# Patient Record
Sex: Male | Born: 1955 | Race: White | Hispanic: No | Marital: Married | Smoking: Never smoker
Health system: Southern US, Community
[De-identification: ages and names within clinical notes are randomized; demographics above are authoritative.]

## PROBLEM LIST (undated history)

## (undated) DIAGNOSIS — E039 Hypothyroidism, unspecified: Secondary | ICD-10-CM

## (undated) DIAGNOSIS — I10 Essential (primary) hypertension: Secondary | ICD-10-CM

## (undated) DIAGNOSIS — C109 Malignant neoplasm of oropharynx, unspecified: Secondary | ICD-10-CM

## (undated) DIAGNOSIS — Z923 Personal history of irradiation: Secondary | ICD-10-CM

## (undated) DIAGNOSIS — R911 Solitary pulmonary nodule: Secondary | ICD-10-CM

## (undated) HISTORY — DX: Hypothyroidism, unspecified: E03.9

## (undated) HISTORY — DX: Solitary pulmonary nodule: R91.1

## (undated) HISTORY — PX: OTHER SURGICAL HISTORY: SHX169

## (undated) HISTORY — DX: Personal history of irradiation: Z92.3

## (undated) HISTORY — PX: WISDOM TOOTH EXTRACTION: SHX21

## (undated) HISTORY — DX: Essential (primary) hypertension: I10

## (undated) HISTORY — DX: Malignant neoplasm of oropharynx, unspecified: C10.9

## (undated) HISTORY — PX: APPENDECTOMY: SHX54

## (undated) HISTORY — PX: COLONOSCOPY: SHX174

---

## 2008-08-11 ENCOUNTER — Ambulatory Visit: Payer: Self-pay | Admitting: Gastroenterology

## 2008-08-25 ENCOUNTER — Ambulatory Visit: Payer: Self-pay | Admitting: Gastroenterology

## 2009-05-07 HISTORY — PX: NECK LESION BIOPSY: SHX2078

## 2009-11-10 ENCOUNTER — Encounter: Admission: RE | Admit: 2009-11-10 | Discharge: 2009-11-10 | Payer: Self-pay | Admitting: Family Medicine

## 2009-11-20 DIAGNOSIS — C109 Malignant neoplasm of oropharynx, unspecified: Secondary | ICD-10-CM

## 2009-11-20 HISTORY — DX: Malignant neoplasm of oropharynx, unspecified: C10.9

## 2009-11-23 ENCOUNTER — Ambulatory Visit (HOSPITAL_COMMUNITY): Admission: RE | Admit: 2009-11-23 | Discharge: 2009-11-23 | Payer: Self-pay | Admitting: Otolaryngology

## 2009-11-29 ENCOUNTER — Ambulatory Visit: Payer: Self-pay | Admitting: Oncology

## 2009-12-01 ENCOUNTER — Encounter: Admission: AD | Admit: 2009-12-01 | Discharge: 2009-12-01 | Payer: Self-pay | Admitting: Dentistry

## 2009-12-01 ENCOUNTER — Ambulatory Visit: Payer: Self-pay | Admitting: Dentistry

## 2009-12-01 ENCOUNTER — Ambulatory Visit: Admission: RE | Admit: 2009-12-01 | Discharge: 2010-03-01 | Payer: Self-pay | Admitting: Radiation Oncology

## 2009-12-02 LAB — CBC WITH DIFFERENTIAL/PLATELET
BASO%: 0.5 % (ref 0.0–2.0)
Basophils Absolute: 0 10*3/uL (ref 0.0–0.1)
HCT: 44.2 % (ref 38.4–49.9)
HGB: 15.3 g/dL (ref 13.0–17.1)
MCV: 86.2 fL (ref 79.3–98.0)
MONO%: 10.2 % (ref 0.0–14.0)
NEUT%: 68.3 % (ref 39.0–75.0)
Platelets: 212 10*3/uL (ref 140–400)
RDW: 13 % (ref 11.0–14.6)

## 2009-12-02 LAB — COMPREHENSIVE METABOLIC PANEL
ALT: 18 U/L (ref 0–53)
AST: 15 U/L (ref 0–37)
Alkaline Phosphatase: 78 U/L (ref 39–117)
Chloride: 104 mEq/L (ref 96–112)
Creatinine, Ser: 1.18 mg/dL (ref 0.40–1.50)
Potassium: 4 mEq/L (ref 3.5–5.3)
Sodium: 140 mEq/L (ref 135–145)
Total Bilirubin: 0.3 mg/dL (ref 0.3–1.2)
Total Protein: 6.9 g/dL (ref 6.0–8.3)

## 2009-12-19 ENCOUNTER — Ambulatory Visit: Payer: Self-pay | Admitting: Dentistry

## 2009-12-26 LAB — COMPREHENSIVE METABOLIC PANEL
ALT: 17 U/L (ref 0–53)
BUN: 15 mg/dL (ref 6–23)
Chloride: 105 mEq/L (ref 96–112)
Creatinine, Ser: 1 mg/dL (ref 0.40–1.50)
Sodium: 138 mEq/L (ref 135–145)
Total Bilirubin: 0.8 mg/dL (ref 0.3–1.2)

## 2009-12-26 LAB — CBC WITH DIFFERENTIAL/PLATELET
Basophils Absolute: 0 10*3/uL (ref 0.0–0.1)
HGB: 14.4 g/dL (ref 13.0–17.1)
MCH: 29.9 pg (ref 27.2–33.4)
MCHC: 34.7 g/dL (ref 32.0–36.0)
MCV: 86.2 fL (ref 79.3–98.0)
MONO%: 10.3 % (ref 0.0–14.0)
NEUT%: 68.3 % (ref 39.0–75.0)
RBC: 4.82 10*6/uL (ref 4.20–5.82)
RDW: 13.3 % (ref 11.0–14.6)
WBC: 6.2 10*3/uL (ref 4.0–10.3)
lymph#: 1.2 10*3/uL (ref 0.9–3.3)

## 2009-12-26 LAB — MAGNESIUM: Magnesium: 2 mg/dL (ref 1.5–2.5)

## 2009-12-27 ENCOUNTER — Ambulatory Visit (HOSPITAL_COMMUNITY): Admission: RE | Admit: 2009-12-27 | Discharge: 2009-12-27 | Payer: Self-pay | Admitting: Oncology

## 2009-12-30 ENCOUNTER — Ambulatory Visit: Payer: Self-pay | Admitting: Oncology

## 2010-01-10 LAB — CBC WITH DIFFERENTIAL/PLATELET
Basophils Absolute: 0.1 10*3/uL (ref 0.0–0.1)
Eosinophils Absolute: 0.2 10*3/uL (ref 0.0–0.5)
HGB: 13.3 g/dL (ref 13.0–17.1)
LYMPH%: 45 % (ref 14.0–49.0)
MCH: 29.6 pg (ref 27.2–33.4)
MONO#: 0.6 10*3/uL (ref 0.1–0.9)
MONO%: 33 % — ABNORMAL HIGH (ref 0.0–14.0)
NEUT#: 0.2 10*3/uL — CL (ref 1.5–6.5)
RBC: 4.5 10*6/uL (ref 4.20–5.82)
RDW: 13.5 % (ref 11.0–14.6)
lymph#: 0.9 10*3/uL (ref 0.9–3.3)

## 2010-01-10 LAB — COMPREHENSIVE METABOLIC PANEL
Alkaline Phosphatase: 78 U/L (ref 39–117)
BUN: 10 mg/dL (ref 6–23)
CO2: 26 mEq/L (ref 19–32)
Calcium: 8.9 mg/dL (ref 8.4–10.5)
Creatinine, Ser: 1 mg/dL (ref 0.40–1.50)
Total Bilirubin: 0.3 mg/dL (ref 0.3–1.2)

## 2010-01-19 LAB — CBC WITH DIFFERENTIAL/PLATELET
BASO%: 1.8 % (ref 0.0–2.0)
Basophils Absolute: 0.1 10*3/uL (ref 0.0–0.1)
HGB: 14.8 g/dL (ref 13.0–17.1)
MONO%: 14.2 % — ABNORMAL HIGH (ref 0.0–14.0)
NEUT%: 55.7 % (ref 39.0–75.0)
Platelets: 173 10*3/uL (ref 140–400)
RDW: 13.9 % (ref 11.0–14.6)
WBC: 5.1 10*3/uL (ref 4.0–10.3)
lymph#: 1.3 10*3/uL (ref 0.9–3.3)
nRBC: 0 % (ref 0–0)

## 2010-01-19 LAB — COMPREHENSIVE METABOLIC PANEL
AST: 17 U/L (ref 0–37)
Albumin: 4 g/dL (ref 3.5–5.2)
CO2: 27 mEq/L (ref 19–32)
Calcium: 9.3 mg/dL (ref 8.4–10.5)
Glucose, Bld: 88 mg/dL (ref 70–99)
Sodium: 138 mEq/L (ref 135–145)
Total Bilirubin: 1 mg/dL (ref 0.3–1.2)

## 2010-01-25 LAB — BASIC METABOLIC PANEL
Calcium: 9.2 mg/dL (ref 8.4–10.5)
Chloride: 101 mEq/L (ref 96–112)
Creatinine, Ser: 0.95 mg/dL (ref 0.40–1.50)
Glucose, Bld: 100 mg/dL — ABNORMAL HIGH (ref 70–99)

## 2010-01-31 ENCOUNTER — Ambulatory Visit: Payer: Self-pay | Admitting: Oncology

## 2010-02-02 LAB — BASIC METABOLIC PANEL
Calcium: 9 mg/dL (ref 8.4–10.5)
Glucose, Bld: 99 mg/dL (ref 70–99)
Sodium: 141 mEq/L (ref 135–145)

## 2010-02-02 LAB — MAGNESIUM: Magnesium: 2.2 mg/dL (ref 1.5–2.5)

## 2010-02-08 LAB — CBC WITH DIFFERENTIAL/PLATELET
BASO%: 1.1 % (ref 0.0–2.0)
Basophils Absolute: 0.1 10*3/uL (ref 0.0–0.1)
HCT: 41.7 % (ref 38.4–49.9)
HGB: 14.1 g/dL (ref 13.0–17.1)
LYMPH%: 21.6 % (ref 14.0–49.0)
MCH: 29.6 pg (ref 27.2–33.4)
MCHC: 33.7 g/dL (ref 32.0–36.0)
MONO#: 0.8 10*3/uL (ref 0.1–0.9)
MONO%: 14.4 % — ABNORMAL HIGH (ref 0.0–14.0)
NEUT%: 61.5 % (ref 39.0–75.0)
Platelets: 239 10*3/uL (ref 140–400)
RDW: 14.9 % — ABNORMAL HIGH (ref 11.0–14.6)

## 2010-02-08 LAB — COMPREHENSIVE METABOLIC PANEL
ALT: 22 U/L (ref 0–53)
AST: 17 U/L (ref 0–37)
CO2: 30 mEq/L (ref 19–32)
Calcium: 9.4 mg/dL (ref 8.4–10.5)
Chloride: 105 mEq/L (ref 96–112)
Glucose, Bld: 97 mg/dL (ref 70–99)
Sodium: 141 mEq/L (ref 135–145)

## 2010-02-08 LAB — MAGNESIUM: Magnesium: 2.1 mg/dL (ref 1.5–2.5)

## 2010-02-16 LAB — BASIC METABOLIC PANEL
CO2: 29 mEq/L (ref 19–32)
Calcium: 9.1 mg/dL (ref 8.4–10.5)
Chloride: 102 mEq/L (ref 96–112)
Potassium: 4.3 mEq/L (ref 3.5–5.3)

## 2010-02-22 LAB — BASIC METABOLIC PANEL
BUN: 17 mg/dL (ref 6–23)
Sodium: 139 mEq/L (ref 135–145)

## 2010-03-01 LAB — CBC WITH DIFFERENTIAL/PLATELET
Eosinophils Absolute: 0.1 10*3/uL (ref 0.0–0.5)
LYMPH%: 27.6 % (ref 14.0–49.0)
MCHC: 34.2 g/dL (ref 32.0–36.0)
MONO#: 0.8 10*3/uL (ref 0.1–0.9)
NEUT#: 2 10*3/uL (ref 1.5–6.5)
NEUT%: 50.1 % (ref 39.0–75.0)
Platelets: 206 10*3/uL (ref 140–400)
WBC: 4 10*3/uL (ref 4.0–10.3)

## 2010-03-01 LAB — COMPREHENSIVE METABOLIC PANEL
ALT: 19 U/L (ref 0–53)
CO2: 30 mEq/L (ref 19–32)
Calcium: 9 mg/dL (ref 8.4–10.5)
Chloride: 103 mEq/L (ref 96–112)
Creatinine, Ser: 1.07 mg/dL (ref 0.40–1.50)
Potassium: 3.8 mEq/L (ref 3.5–5.3)
Sodium: 138 mEq/L (ref 135–145)
Total Bilirubin: 0.4 mg/dL (ref 0.3–1.2)

## 2010-03-02 ENCOUNTER — Ambulatory Visit: Payer: Self-pay | Admitting: Oncology

## 2010-03-10 LAB — CBC WITH DIFFERENTIAL/PLATELET
BASO%: 3 % — ABNORMAL HIGH (ref 0.0–2.0)
Basophils Absolute: 0.1 10*3/uL (ref 0.0–0.1)
EOS%: 1.9 % (ref 0.0–7.0)
HCT: 35.8 % — ABNORMAL LOW (ref 38.4–49.9)
HGB: 12.4 g/dL — ABNORMAL LOW (ref 13.0–17.1)
LYMPH%: 44.7 % (ref 14.0–49.0)
MCH: 30.6 pg (ref 27.2–33.4)
MCHC: 34.6 g/dL (ref 32.0–36.0)
NEUT%: 43.9 % (ref 39.0–75.0)
Platelets: 115 10*3/uL — ABNORMAL LOW (ref 140–400)

## 2010-03-10 LAB — COMPREHENSIVE METABOLIC PANEL
ALT: 16 U/L (ref 0–53)
BUN: 16 mg/dL (ref 6–23)
CO2: 27 mEq/L (ref 19–32)
Calcium: 8.7 mg/dL (ref 8.4–10.5)
Creatinine, Ser: 1.39 mg/dL (ref 0.40–1.50)
Total Bilirubin: 0.6 mg/dL (ref 0.3–1.2)

## 2010-03-10 LAB — MAGNESIUM: Magnesium: 2.1 mg/dL (ref 1.5–2.5)

## 2010-03-15 ENCOUNTER — Ambulatory Visit (HOSPITAL_COMMUNITY): Admission: RE | Admit: 2010-03-15 | Discharge: 2010-03-15 | Payer: Self-pay | Admitting: Oncology

## 2010-03-17 ENCOUNTER — Ambulatory Visit
Admission: RE | Admit: 2010-03-17 | Discharge: 2010-05-26 | Payer: Self-pay | Source: Home / Self Care | Attending: Radiation Oncology | Admitting: Radiation Oncology

## 2010-03-21 LAB — CBC WITH DIFFERENTIAL/PLATELET
Basophils Absolute: 0 10*3/uL (ref 0.0–0.1)
EOS%: 0.4 % (ref 0.0–7.0)
Eosinophils Absolute: 0 10*3/uL (ref 0.0–0.5)
HCT: 37.6 % — ABNORMAL LOW (ref 38.4–49.9)
HGB: 13 g/dL (ref 13.0–17.1)
MCH: 31 pg (ref 27.2–33.4)
MONO#: 0.7 10*3/uL (ref 0.1–0.9)
NEUT#: 2.3 10*3/uL (ref 1.5–6.5)
NEUT%: 57.3 % (ref 39.0–75.0)
RDW: 17 % — ABNORMAL HIGH (ref 11.0–14.6)
lymph#: 0.9 10*3/uL (ref 0.9–3.3)

## 2010-03-21 LAB — COMPREHENSIVE METABOLIC PANEL
Albumin: 3.3 g/dL — ABNORMAL LOW (ref 3.5–5.2)
BUN: 20 mg/dL (ref 6–23)
CO2: 29 mEq/L (ref 19–32)
Calcium: 9.2 mg/dL (ref 8.4–10.5)
Chloride: 104 mEq/L (ref 96–112)
Creatinine, Ser: 1.17 mg/dL (ref 0.40–1.50)
Glucose, Bld: 99 mg/dL (ref 70–99)
Potassium: 4.6 mEq/L (ref 3.5–5.3)

## 2010-03-21 LAB — MAGNESIUM: Magnesium: 2.2 mg/dL (ref 1.5–2.5)

## 2010-03-22 LAB — HEPATITIS B SURFACE ANTIBODY,QUALITATIVE: Hep B S Ab: NEGATIVE

## 2010-03-22 LAB — HEPATITIS B SURFACE ANTIGEN: Hepatitis B Surface Ag: NEGATIVE

## 2010-03-23 ENCOUNTER — Ambulatory Visit: Payer: Self-pay | Admitting: Dentistry

## 2010-04-04 ENCOUNTER — Ambulatory Visit: Payer: Self-pay | Admitting: Oncology

## 2010-04-06 LAB — CBC WITH DIFFERENTIAL/PLATELET
Basophils Absolute: 0.1 10*3/uL (ref 0.0–0.1)
Eosinophils Absolute: 0.3 10*3/uL (ref 0.0–0.5)
HCT: 39.9 % (ref 38.4–49.9)
HGB: 13.5 g/dL (ref 13.0–17.1)
LYMPH%: 23.3 % (ref 14.0–49.0)
MCV: 89.3 fL (ref 79.3–98.0)
MONO#: 0.7 10*3/uL (ref 0.1–0.9)
RBC: 4.47 10*6/uL (ref 4.20–5.82)
nRBC: 0 % (ref 0–0)

## 2010-04-06 LAB — COMPREHENSIVE METABOLIC PANEL
AST: 24 U/L (ref 0–37)
Alkaline Phosphatase: 59 U/L (ref 39–117)
CO2: 24 mEq/L (ref 19–32)
Calcium: 9 mg/dL (ref 8.4–10.5)
Chloride: 105 mEq/L (ref 96–112)
Total Protein: 6.2 g/dL (ref 6.0–8.3)

## 2010-04-06 LAB — MAGNESIUM: Magnesium: 1.9 mg/dL (ref 1.5–2.5)

## 2010-04-13 LAB — CBC WITH DIFFERENTIAL/PLATELET
BASO%: 1.1 % (ref 0.0–2.0)
Basophils Absolute: 0.1 10*3/uL (ref 0.0–0.1)
Eosinophils Absolute: 0.1 10*3/uL (ref 0.0–0.5)
HCT: 39.8 % (ref 38.4–49.9)
LYMPH%: 19 % (ref 14.0–49.0)
MCV: 88.2 fL (ref 79.3–98.0)
NEUT%: 62 % (ref 39.0–75.0)
Platelets: 144 10*3/uL (ref 140–400)
nRBC: 0 % (ref 0–0)

## 2010-04-13 LAB — COMPREHENSIVE METABOLIC PANEL
AST: 20 U/L (ref 0–37)
BUN: 17 mg/dL (ref 6–23)
Calcium: 8.8 mg/dL (ref 8.4–10.5)
Chloride: 105 mEq/L (ref 96–112)
Creatinine, Ser: 0.93 mg/dL (ref 0.40–1.50)
Potassium: 4 mEq/L (ref 3.5–5.3)
Total Bilirubin: 0.6 mg/dL (ref 0.3–1.2)
Total Protein: 5.9 g/dL — ABNORMAL LOW (ref 6.0–8.3)

## 2010-04-13 LAB — MAGNESIUM: Magnesium: 2 mg/dL (ref 1.5–2.5)

## 2010-04-20 LAB — COMPREHENSIVE METABOLIC PANEL
AST: 16 U/L (ref 0–37)
Albumin: 3.8 g/dL (ref 3.5–5.2)
Alkaline Phosphatase: 55 U/L (ref 39–117)
BUN: 10 mg/dL (ref 6–23)
Creatinine, Ser: 0.98 mg/dL (ref 0.40–1.50)
Potassium: 3.8 mEq/L (ref 3.5–5.3)
Total Bilirubin: 0.9 mg/dL (ref 0.3–1.2)

## 2010-04-20 LAB — CBC WITH DIFFERENTIAL/PLATELET
Basophils Absolute: 0 10*3/uL (ref 0.0–0.1)
Eosinophils Absolute: 0.1 10*3/uL (ref 0.0–0.5)
HCT: 40.3 % (ref 38.4–49.9)
HGB: 14 g/dL (ref 13.0–17.1)
LYMPH%: 11.2 % — ABNORMAL LOW (ref 14.0–49.0)
MCV: 88 fL (ref 79.3–98.0)
MONO%: 15.6 % — ABNORMAL HIGH (ref 0.0–14.0)
NEUT#: 3.4 10*3/uL (ref 1.5–6.5)
NEUT%: 70.7 % (ref 39.0–75.0)
Platelets: 133 10*3/uL — ABNORMAL LOW (ref 140–400)

## 2010-04-27 LAB — COMPREHENSIVE METABOLIC PANEL
ALT: 12 U/L (ref 0–53)
Alkaline Phosphatase: 61 U/L (ref 39–117)
BUN: 14 mg/dL (ref 6–23)
CO2: 25 mEq/L (ref 19–32)
Creatinine, Ser: 0.89 mg/dL (ref 0.40–1.50)
Glucose, Bld: 91 mg/dL (ref 70–99)
Sodium: 140 mEq/L (ref 135–145)

## 2010-04-27 LAB — CBC WITH DIFFERENTIAL/PLATELET
BASO%: 0.7 % (ref 0.0–2.0)
Basophils Absolute: 0 10*3/uL (ref 0.0–0.1)
EOS%: 4 % (ref 0.0–7.0)
Eosinophils Absolute: 0.2 10*3/uL (ref 0.0–0.5)
HCT: 40.3 % (ref 38.4–49.9)
LYMPH%: 8.3 % — ABNORMAL LOW (ref 14.0–49.0)
MCV: 87.6 fL (ref 79.3–98.0)
MONO#: 0.8 10*3/uL (ref 0.1–0.9)
RBC: 4.6 10*6/uL (ref 4.20–5.82)
nRBC: 0 % (ref 0–0)

## 2010-05-04 ENCOUNTER — Ambulatory Visit: Payer: Self-pay | Admitting: Oncology

## 2010-05-04 LAB — CBC WITH DIFFERENTIAL/PLATELET
EOS%: 2.5 % (ref 0.0–7.0)
Eosinophils Absolute: 0.1 10*3/uL (ref 0.0–0.5)
HGB: 13.8 g/dL (ref 13.0–17.1)
LYMPH%: 8.5 % — ABNORMAL LOW (ref 14.0–49.0)
MCH: 30.1 pg (ref 27.2–33.4)
MCHC: 34.4 g/dL (ref 32.0–36.0)
MONO%: 13.2 % (ref 0.0–14.0)
Platelets: 126 10*3/uL — ABNORMAL LOW (ref 140–400)
RBC: 4.58 10*6/uL (ref 4.20–5.82)
WBC: 4.9 10*3/uL (ref 4.0–10.3)
nRBC: 0 % (ref 0–0)

## 2010-05-04 LAB — COMPREHENSIVE METABOLIC PANEL
AST: 11 U/L (ref 0–37)
Alkaline Phosphatase: 62 U/L (ref 39–117)
BUN: 18 mg/dL (ref 6–23)
Creatinine, Ser: 0.94 mg/dL (ref 0.40–1.50)
Glucose, Bld: 105 mg/dL — ABNORMAL HIGH (ref 70–99)
Sodium: 136 mEq/L (ref 135–145)
Total Protein: 6.1 g/dL (ref 6.0–8.3)

## 2010-05-04 LAB — MAGNESIUM: Magnesium: 1.7 mg/dL (ref 1.5–2.5)

## 2010-05-11 ENCOUNTER — Ambulatory Visit: Payer: Self-pay | Admitting: Oncology

## 2010-05-11 LAB — COMPREHENSIVE METABOLIC PANEL
ALT: 20 U/L (ref 0–53)
AST: 17 U/L (ref 0–37)
Albumin: 3.1 g/dL — ABNORMAL LOW (ref 3.5–5.2)
Alkaline Phosphatase: 60 U/L (ref 39–117)
BUN: 11 mg/dL (ref 6–23)
CO2: 29 mEq/L (ref 19–32)
Calcium: 8.9 mg/dL (ref 8.4–10.5)
Chloride: 103 mEq/L (ref 96–112)
Creatinine, Ser: 0.87 mg/dL (ref 0.40–1.50)
Glucose, Bld: 113 mg/dL — ABNORMAL HIGH (ref 70–99)
Potassium: 4.4 mEq/L (ref 3.5–5.3)
Sodium: 139 mEq/L (ref 135–145)
Total Bilirubin: 0.5 mg/dL (ref 0.3–1.2)
Total Protein: 5.8 g/dL — ABNORMAL LOW (ref 6.0–8.3)

## 2010-05-11 LAB — CBC WITH DIFFERENTIAL/PLATELET
BASO%: 0.8 % (ref 0.0–2.0)
Basophils Absolute: 0 10*3/uL (ref 0.0–0.1)
EOS%: 3.9 % (ref 0.0–7.0)
Eosinophils Absolute: 0.1 10*3/uL (ref 0.0–0.5)
HCT: 39.7 % (ref 38.4–49.9)
HGB: 13.8 g/dL (ref 13.0–17.1)
LYMPH%: 13.2 % — ABNORMAL LOW (ref 14.0–49.0)
MCH: 30.5 pg (ref 27.2–33.4)
MCHC: 34.8 g/dL (ref 32.0–36.0)
MCV: 87.6 fL (ref 79.3–98.0)
MONO#: 0.5 10*3/uL (ref 0.1–0.9)
MONO%: 12.6 % (ref 0.0–14.0)
NEUT#: 2.5 10*3/uL (ref 1.5–6.5)
NEUT%: 69.5 % (ref 39.0–75.0)
Platelets: 140 10*3/uL (ref 140–400)
RBC: 4.53 10*6/uL (ref 4.20–5.82)
RDW: 14.6 % (ref 11.0–14.6)
WBC: 3.6 10*3/uL — ABNORMAL LOW (ref 4.0–10.3)
lymph#: 0.5 10*3/uL — ABNORMAL LOW (ref 0.9–3.3)
nRBC: 0 % (ref 0–0)

## 2010-05-11 LAB — MAGNESIUM: Magnesium: 1.9 mg/dL (ref 1.5–2.5)

## 2010-05-18 LAB — COMPREHENSIVE METABOLIC PANEL
ALT: 13 U/L (ref 0–53)
AST: 13 U/L (ref 0–37)
Albumin: 3.7 g/dL (ref 3.5–5.2)
Alkaline Phosphatase: 64 U/L (ref 39–117)
BUN: 11 mg/dL (ref 6–23)
CO2: 26 mEq/L (ref 19–32)
Calcium: 8.7 mg/dL (ref 8.4–10.5)
Chloride: 102 mEq/L (ref 96–112)
Creatinine, Ser: 0.88 mg/dL (ref 0.40–1.50)
Glucose, Bld: 133 mg/dL — ABNORMAL HIGH (ref 70–99)
Potassium: 3.9 mEq/L (ref 3.5–5.3)
Sodium: 139 mEq/L (ref 135–145)
Total Bilirubin: 0.4 mg/dL (ref 0.3–1.2)
Total Protein: 5.9 g/dL — ABNORMAL LOW (ref 6.0–8.3)

## 2010-05-18 LAB — CBC WITH DIFFERENTIAL/PLATELET
BASO%: 0.2 % (ref 0.0–2.0)
Basophils Absolute: 0 10*3/uL (ref 0.0–0.1)
EOS%: 1.6 % (ref 0.0–7.0)
Eosinophils Absolute: 0 10*3/uL (ref 0.0–0.5)
HCT: 35.3 % — ABNORMAL LOW (ref 38.4–49.9)
HGB: 12.2 g/dL — ABNORMAL LOW (ref 13.0–17.1)
LYMPH%: 6.1 % — ABNORMAL LOW (ref 14.0–49.0)
MCH: 30.8 pg (ref 27.2–33.4)
MCHC: 34.4 g/dL (ref 32.0–36.0)
MCV: 89.3 fL (ref 79.3–98.0)
MONO#: 0.5 10*3/uL (ref 0.1–0.9)
MONO%: 19.3 % — ABNORMAL HIGH (ref 0.0–14.0)
NEUT#: 1.8 10*3/uL (ref 1.5–6.5)
NEUT%: 72.8 % (ref 39.0–75.0)
Platelets: 145 10*3/uL (ref 140–400)
RBC: 3.96 10*6/uL — ABNORMAL LOW (ref 4.20–5.82)
RDW: 15.8 % — ABNORMAL HIGH (ref 11.0–14.6)
WBC: 2.5 10*3/uL — ABNORMAL LOW (ref 4.0–10.3)
lymph#: 0.2 10*3/uL — ABNORMAL LOW (ref 0.9–3.3)

## 2010-05-18 LAB — MAGNESIUM: Magnesium: 1.8 mg/dL (ref 1.5–2.5)

## 2010-06-05 ENCOUNTER — Other Ambulatory Visit: Payer: Self-pay | Admitting: Oncology

## 2010-06-05 DIAGNOSIS — C099 Malignant neoplasm of tonsil, unspecified: Secondary | ICD-10-CM

## 2010-06-05 LAB — BASIC METABOLIC PANEL
BUN: 7 mg/dL (ref 6–23)
CO2: 28 mEq/L (ref 19–32)
Calcium: 9 mg/dL (ref 8.4–10.5)
Chloride: 104 mEq/L (ref 96–112)
Potassium: 4 mEq/L (ref 3.5–5.3)
Sodium: 139 mEq/L (ref 135–145)

## 2010-06-05 LAB — CBC WITH DIFFERENTIAL/PLATELET
BASO%: 0.3 % (ref 0.0–2.0)
HGB: 13.7 g/dL (ref 13.0–17.1)
MCV: 89.6 fL (ref 79.3–98.0)
MONO#: 0.5 10*3/uL (ref 0.1–0.9)
NEUT#: 1.8 10*3/uL (ref 1.5–6.5)
Platelets: 209 10*3/uL (ref 140–400)
RDW: 16.1 % — ABNORMAL HIGH (ref 11.0–14.6)

## 2010-06-07 ENCOUNTER — Ambulatory Visit: Payer: Managed Care, Other (non HMO) | Admitting: Radiation Oncology

## 2010-06-20 ENCOUNTER — Other Ambulatory Visit (HOSPITAL_COMMUNITY): Payer: Managed Care, Other (non HMO) | Admitting: Dentistry

## 2010-06-20 DIAGNOSIS — K121 Other forms of stomatitis: Secondary | ICD-10-CM

## 2010-06-20 DIAGNOSIS — K117 Disturbances of salivary secretion: Secondary | ICD-10-CM

## 2010-06-20 DIAGNOSIS — K1230 Oral mucositis (ulcerative), unspecified: Secondary | ICD-10-CM

## 2010-06-20 DIAGNOSIS — T8189XA Other complications of procedures, not elsewhere classified, initial encounter: Secondary | ICD-10-CM

## 2010-06-23 ENCOUNTER — Ambulatory Visit: Payer: Managed Care, Other (non HMO) | Admitting: Radiation Oncology

## 2010-06-30 ENCOUNTER — Ambulatory Visit: Payer: Managed Care, Other (non HMO) | Attending: Radiation Oncology | Admitting: Radiation Oncology

## 2010-07-21 LAB — BASIC METABOLIC PANEL
CO2: 29 mEq/L (ref 19–32)
Chloride: 106 mEq/L (ref 96–112)
GFR calc Af Amer: >60 mL/min (ref >60)
Sodium: 140 mEq/L (ref 135–145)

## 2010-07-21 LAB — CBC
HCT: 42 % (ref 39.0–52.0)
Hemoglobin: 14.7 g/dL (ref 13.0–17.0)
MCH: 29.2 pg (ref 26.0–34.0)
MCV: 83.3 fL (ref 78.0–100.0)
RBC: 5.04 MIL/uL (ref 4.22–5.81)

## 2010-07-22 LAB — GLUCOSE, CAPILLARY: Glucose-Capillary: 86 mg/dL (ref 70–99)

## 2010-08-22 ENCOUNTER — Encounter (HOSPITAL_COMMUNITY)
Admission: RE | Admit: 2010-08-22 | Discharge: 2010-08-22 | Disposition: A | Discharge status: Home / Self Care | Payer: Managed Care, Other (non HMO) | Source: Ambulatory Visit | Attending: Oncology | Admitting: Oncology

## 2010-08-22 ENCOUNTER — Encounter (HOSPITAL_COMMUNITY): Payer: Self-pay

## 2010-08-22 DIAGNOSIS — I77810 Thoracic aortic ectasia: Secondary | ICD-10-CM | POA: Insufficient documentation

## 2010-08-22 DIAGNOSIS — Z923 Personal history of irradiation: Secondary | ICD-10-CM | POA: Insufficient documentation

## 2010-08-22 DIAGNOSIS — C099 Malignant neoplasm of tonsil, unspecified: Secondary | ICD-10-CM

## 2010-08-22 DIAGNOSIS — J984 Other disorders of lung: Secondary | ICD-10-CM | POA: Insufficient documentation

## 2010-08-22 DIAGNOSIS — Z9221 Personal history of antineoplastic chemotherapy: Secondary | ICD-10-CM | POA: Insufficient documentation

## 2010-08-22 DIAGNOSIS — C760 Malignant neoplasm of head, face and neck: Secondary | ICD-10-CM | POA: Insufficient documentation

## 2010-08-22 LAB — GLUCOSE, CAPILLARY: Glucose-Capillary: 92 mg/dL (ref 70–99)

## 2010-08-22 MED ORDER — FLUDEOXYGLUCOSE F - 18 (FDG) INJECTION: 16.0000 | Freq: Once | INTRAVENOUS | Status: AC | PRN

## 2010-08-23 ENCOUNTER — Encounter (HOSPITAL_BASED_OUTPATIENT_CLINIC_OR_DEPARTMENT_OTHER): Payer: Managed Care, Other (non HMO) | Admitting: Oncology

## 2010-08-23 ENCOUNTER — Other Ambulatory Visit: Payer: Self-pay | Admitting: Oncology

## 2010-08-23 DIAGNOSIS — C099 Malignant neoplasm of tonsil, unspecified: Secondary | ICD-10-CM

## 2010-08-23 DIAGNOSIS — R634 Abnormal weight loss: Secondary | ICD-10-CM

## 2010-08-23 LAB — CBC WITH DIFFERENTIAL/PLATELET
Basophils Absolute: 0 10*3/uL (ref 0.0–0.1)
EOS%: 1.1 % (ref 0.0–7.0)
HGB: 12.5 g/dL — ABNORMAL LOW (ref 13.0–17.1)
LYMPH%: 8.4 % — ABNORMAL LOW (ref 14.0–49.0)
MCH: 30.8 pg (ref 27.2–33.4)
MCV: 90 fL (ref 79.3–98.0)
MONO%: 9.2 % (ref 0.0–14.0)
NEUT%: 80.8 % — ABNORMAL HIGH (ref 39.0–75.0)
Platelets: 182 10*3/uL (ref 140–400)
RDW: 13.5 % (ref 11.0–14.6)

## 2010-08-23 LAB — COMPREHENSIVE METABOLIC PANEL
AST: 14 U/L (ref 0–37)
Alkaline Phosphatase: 80 U/L (ref 39–117)
BUN: 10 mg/dL (ref 6–23)
Creatinine, Ser: 1 mg/dL (ref 0.40–1.50)
Potassium: 4 mEq/L (ref 3.5–5.3)
Total Bilirubin: 0.5 mg/dL (ref 0.3–1.2)

## 2010-08-25 ENCOUNTER — Other Ambulatory Visit: Payer: Self-pay | Admitting: Oncology

## 2010-08-25 DIAGNOSIS — C099 Malignant neoplasm of tonsil, unspecified: Secondary | ICD-10-CM

## 2010-09-12 ENCOUNTER — Other Ambulatory Visit (HOSPITAL_COMMUNITY): Payer: Self-pay

## 2010-09-12 ENCOUNTER — Ambulatory Visit (HOSPITAL_COMMUNITY)
Admission: RE | Admit: 2010-09-12 | Discharge: 2010-09-12 | Disposition: A | Discharge status: Home / Self Care | Payer: Managed Care, Other (non HMO) | Source: Ambulatory Visit | Attending: Oncology | Admitting: Oncology

## 2010-09-12 DIAGNOSIS — C099 Malignant neoplasm of tonsil, unspecified: Secondary | ICD-10-CM

## 2010-09-12 DIAGNOSIS — Z452 Encounter for adjustment and management of vascular access device: Secondary | ICD-10-CM | POA: Insufficient documentation

## 2010-10-20 ENCOUNTER — Other Ambulatory Visit (HOSPITAL_COMMUNITY): Payer: Self-pay | Admitting: Diagnostic Radiology

## 2010-10-20 ENCOUNTER — Other Ambulatory Visit (HOSPITAL_COMMUNITY): Payer: Managed Care, Other (non HMO)

## 2010-10-20 DIAGNOSIS — C099 Malignant neoplasm of tonsil, unspecified: Secondary | ICD-10-CM

## 2010-12-01 ENCOUNTER — Ambulatory Visit
Admission: RE | Admit: 2010-12-01 | Discharge: 2010-12-01 | Disposition: A | Discharge status: Home / Self Care | Payer: Managed Care, Other (non HMO) | Source: Ambulatory Visit | Attending: Radiation Oncology | Admitting: Radiation Oncology

## 2011-02-15 ENCOUNTER — Ambulatory Visit (HOSPITAL_COMMUNITY)
Admission: RE | Admit: 2011-02-15 | Discharge: 2011-02-15 | Disposition: A | Discharge status: Home / Self Care | Payer: Managed Care, Other (non HMO) | Source: Ambulatory Visit | Attending: Oncology | Admitting: Oncology

## 2011-02-15 DIAGNOSIS — Z923 Personal history of irradiation: Secondary | ICD-10-CM | POA: Insufficient documentation

## 2011-02-15 DIAGNOSIS — C760 Malignant neoplasm of head, face and neck: Secondary | ICD-10-CM | POA: Insufficient documentation

## 2011-02-15 DIAGNOSIS — Z9221 Personal history of antineoplastic chemotherapy: Secondary | ICD-10-CM | POA: Insufficient documentation

## 2011-02-15 MED ORDER — IOHEXOL 300 MG/ML  SOLN
100.0000 mL | Freq: Once | INTRAMUSCULAR | Status: AC | PRN
  Administered 2011-02-15: 100 mL via INTRAVENOUS

## 2011-02-23 ENCOUNTER — Encounter: Payer: Self-pay | Admitting: Oncology

## 2011-02-23 ENCOUNTER — Other Ambulatory Visit: Payer: Self-pay | Admitting: Oncology

## 2011-02-23 ENCOUNTER — Ambulatory Visit (HOSPITAL_BASED_OUTPATIENT_CLINIC_OR_DEPARTMENT_OTHER): Payer: Managed Care, Other (non HMO) | Admitting: Oncology

## 2011-02-23 DIAGNOSIS — C109 Malignant neoplasm of oropharynx, unspecified: Secondary | ICD-10-CM

## 2011-02-23 DIAGNOSIS — C099 Malignant neoplasm of tonsil, unspecified: Secondary | ICD-10-CM

## 2011-02-23 DIAGNOSIS — R634 Abnormal weight loss: Secondary | ICD-10-CM

## 2011-02-23 LAB — CBC WITH DIFFERENTIAL/PLATELET
Basophils Absolute: 0 10*3/uL (ref 0.0–0.1)
Eosinophils Absolute: 0 10*3/uL (ref 0.0–0.5)
HCT: 40.3 % (ref 38.4–49.9)
HGB: 13.9 g/dL (ref 13.0–17.1)
LYMPH%: 12 % — ABNORMAL LOW (ref 14.0–49.0)
MCV: 87.6 fL (ref 79.3–98.0)
MONO#: 0.4 10*3/uL (ref 0.1–0.9)
MONO%: 11.1 % (ref 0.0–14.0)
NEUT#: 2.6 10*3/uL (ref 1.5–6.5)
Platelets: 177 10*3/uL (ref 140–400)
WBC: 3.5 10*3/uL — ABNORMAL LOW (ref 4.0–10.3)

## 2011-02-23 LAB — COMPREHENSIVE METABOLIC PANEL
Albumin: 4.2 g/dL (ref 3.5–5.2)
Alkaline Phosphatase: 75 U/L (ref 39–117)
BUN: 11 mg/dL (ref 6–23)
CO2: 23 mEq/L (ref 19–32)
Glucose, Bld: 98 mg/dL (ref 70–99)
Potassium: 4.1 mEq/L (ref 3.5–5.3)
Total Bilirubin: 0.4 mg/dL (ref 0.3–1.2)
Total Protein: 6.7 g/dL (ref 6.0–8.3)

## 2011-02-23 LAB — TSH: TSH: 6.625 u[IU]/mL — ABNORMAL HIGH (ref 0.350–4.500)

## 2011-02-26 LAB — T4, FREE: Free T4: 1 ng/dL (ref 0.80–1.80)

## 2011-05-22 ENCOUNTER — Encounter: Payer: Self-pay | Admitting: *Deleted

## 2011-05-22 DIAGNOSIS — Z923 Personal history of irradiation: Secondary | ICD-10-CM | POA: Insufficient documentation

## 2011-05-22 NOTE — Progress Notes (Signed)
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

## 2011-06-01 ENCOUNTER — Ambulatory Visit: Payer: Managed Care, Other (non HMO) | Admitting: Radiation Oncology

## 2011-08-22 ENCOUNTER — Other Ambulatory Visit: Payer: Self-pay | Admitting: Oncology

## 2011-08-22 DIAGNOSIS — C109 Malignant neoplasm of oropharynx, unspecified: Secondary | ICD-10-CM

## 2011-08-23 ENCOUNTER — Other Ambulatory Visit (HOSPITAL_BASED_OUTPATIENT_CLINIC_OR_DEPARTMENT_OTHER): Payer: Managed Care, Other (non HMO) | Admitting: Lab

## 2011-08-23 ENCOUNTER — Ambulatory Visit (HOSPITAL_COMMUNITY)
Admission: RE | Admit: 2011-08-23 | Discharge: 2011-08-23 | Disposition: A | Discharge status: Home / Self Care | Payer: Managed Care, Other (non HMO) | Source: Ambulatory Visit | Attending: Oncology | Admitting: Oncology

## 2011-08-23 DIAGNOSIS — Z923 Personal history of irradiation: Secondary | ICD-10-CM | POA: Insufficient documentation

## 2011-08-23 DIAGNOSIS — C109 Malignant neoplasm of oropharynx, unspecified: Secondary | ICD-10-CM

## 2011-08-23 DIAGNOSIS — Z9221 Personal history of antineoplastic chemotherapy: Secondary | ICD-10-CM | POA: Insufficient documentation

## 2011-08-23 MED ORDER — IOHEXOL 300 MG/ML  SOLN
100.0000 mL | Freq: Once | INTRAMUSCULAR | Status: AC | PRN
  Administered 2011-08-23: 100 mL via INTRAVENOUS

## 2011-08-24 ENCOUNTER — Telehealth: Payer: Self-pay | Admitting: Oncology

## 2011-08-24 ENCOUNTER — Ambulatory Visit (HOSPITAL_BASED_OUTPATIENT_CLINIC_OR_DEPARTMENT_OTHER): Payer: Managed Care, Other (non HMO) | Admitting: Oncology

## 2011-08-24 ENCOUNTER — Ambulatory Visit (HOSPITAL_BASED_OUTPATIENT_CLINIC_OR_DEPARTMENT_OTHER): Payer: Managed Care, Other (non HMO) | Admitting: Lab

## 2011-08-24 ENCOUNTER — Encounter: Payer: Self-pay | Admitting: Oncology

## 2011-08-24 VITALS — BP 151/85 | HR 56 | Temp 97.0°F | Ht 66.0 in | Wt 130.9 lb

## 2011-08-24 DIAGNOSIS — C109 Malignant neoplasm of oropharynx, unspecified: Secondary | ICD-10-CM

## 2011-08-24 DIAGNOSIS — C50919 Malignant neoplasm of unspecified site of unspecified female breast: Secondary | ICD-10-CM

## 2011-08-24 DIAGNOSIS — C770 Secondary and unspecified malignant neoplasm of lymph nodes of head, face and neck: Secondary | ICD-10-CM

## 2011-08-24 DIAGNOSIS — C779 Secondary and unspecified malignant neoplasm of lymph node, unspecified: Secondary | ICD-10-CM

## 2011-08-24 DIAGNOSIS — C801 Malignant (primary) neoplasm, unspecified: Secondary | ICD-10-CM

## 2011-08-24 DIAGNOSIS — B977 Papillomavirus as the cause of diseases classified elsewhere: Secondary | ICD-10-CM

## 2011-08-24 LAB — CMP (CANCER CENTER ONLY)
CO2: 28 mEq/L (ref 18–33)
Calcium: 9.1 mg/dL (ref 8.0–10.3)
Chloride: 100 mEq/L (ref 98–108)
Creat: 1.2 mg/dl (ref 0.6–1.2)
Glucose, Bld: 102 mg/dL (ref 73–118)
Total Bilirubin: 0.6 mg/dl (ref 0.20–1.60)
Total Protein: 6.8 g/dL (ref 6.4–8.1)

## 2011-08-24 LAB — CBC WITH DIFFERENTIAL/PLATELET
BASO%: 1.5 % (ref 0.0–2.0)
EOS%: 3.2 % (ref 0.0–7.0)
HCT: 38.5 % (ref 38.4–49.9)
MCH: 29.4 pg (ref 27.2–33.4)
MCHC: 33.9 g/dL (ref 32.0–36.0)
MONO#: 0.5 10*3/uL (ref 0.1–0.9)
NEUT%: 67.4 % (ref 39.0–75.0)
RDW: 13.4 % (ref 11.0–14.6)
WBC: 3.3 10*3/uL — ABNORMAL LOW (ref 4.0–10.3)
lymph#: 0.4 10*3/uL — ABNORMAL LOW (ref 0.9–3.3)
nRBC: 0 % (ref 0–0)

## 2011-08-24 LAB — TSH: TSH: 7.394 u[IU]/mL — ABNORMAL HIGH (ref 0.350–4.500)

## 2011-08-24 NOTE — Telephone Encounter (Signed)
gve the pt his oct 2013 appt  Calendar along with the ct scan appt .

## 2011-08-24 NOTE — Progress Notes (Signed)
[facility]  Telephone:([phone] Fax:([phone]   OFFICE PROGRESS NOTE   Cc:  Aura Dials, MD, MD  DIAGNOSIS: History of a cT4N2cM0 invasive squamous cell carcinoma.  PAST THERAPY: Induction chemotherapy (cisplatin/5FU/Taxotere) between December 29, 2009 and 03/02/10.  He was on concurrent weekly Carboplatin and daily XRT on 04/06/2010.  He finished weekly Carboplatin on 05/11/2010 and XRT on 05/25/2010.  CURRENT THERAPY: Watchful observation.  INTERVAL HISTORY: Kevin [name] 56 y.o. male returns for regular follow-up with his wife.  He reports that he is working about 60-70 hours a week, working for [employer].  He denies any dysphagia or odynophagia; however, he still has some dry mouth.  However, he thinks his saliva is kind of starting to work again.  He denied any palpable lymph node swelling, productive cough, shortness of breath, chest pain, abdominal pain, abdominal swelling.   Past Medical History  Diagnosis Date  . Oropharynx cancer   . Cancer 11/20/2009    tonsil  . Hx of radiation therapy 04/05/10 to 05/25/10    Past Surgical History  Procedure Date  . Appendectomy     childhood    No current outpatient prescriptions on file.    ALLERGIES:   has no known allergies.  REVIEW OF SYSTEMS:  The rest of the 14-point review of system was negative.   Filed Vitals:   08/24/11 1510  BP: 151/85  Pulse: 56  Temp: 97 F (36.1 C)   Wt Readings from Last 3 Encounters:  08/24/11 130 lb 14.4 oz (59.376 kg)   ECOG Performance status: 0  PHYSICAL EXAMINATION:  General:  thin-appearing man  in no acute distress.  Eyes:  no scleral icterus.  ENT:  There were no oropharyngeal lesions.  Neck was without thyromegaly.  Lymphatics:  Negative cervical, supraclavicular or axillary adenopathy.  Respiratory: lungs were clear bilaterally without wheezing or crackles.  Cardiovascular:  Regular rate and rhythm, S1/S2, without murmur, rub or gallop.  There  was no pedal edema.  GI:  abdomen was soft, flat, nontender, nondistended, without organomegaly.  Muscoloskeletal:  no spinal tenderness of palpation of vertebral spine.  Skin exam was without echymosis, petichae.  Neuro exam was nonfocal.  Patient was able to get on and off exam table without assistance.  Gait was normal.  Patient was alerted and oriented.  Attention was good.   Language was appropriate.  Mood was normal without depression.  Speech was not pressured.  Thought content was not tangential.    LABORATORY/RADIOLOGY DATA:  Lab Results  Component Value Date   WBC 3.3* 08/24/2011   HGB 13.0 08/24/2011   HCT 38.5 08/24/2011   PLT 198 08/24/2011   GLUCOSE 98 02/23/2011   ALKPHOS 75 02/23/2011   ALT 11 02/23/2011   AST 15 02/23/2011   NA 138 02/23/2011   K 4.1 02/23/2011   CL 102 02/23/2011   CREATININE 1.12 02/23/2011   BUN 11 02/23/2011   CO2 23 02/23/2011   INR 0.97 12/27/2009    Dg Chest 2 View  08/23/2011  *RADIOLOGY REPORT*  Clinical Data: Oral pharyngeal cancer, rule out metastatic disease  CHEST - 2 VIEW  Comparison: 11/10/2009  Findings: Cardiomediastinal silhouette is stable.  No acute infiltrate or pleural effusion.  No pulmonary edema.  Bony thorax is stable.  IMPRESSION: No active disease.  No significant change.  Original Report Authenticated By: Natasha Mead, M.D.   Ct Soft Tissue Neck W Contrast  08/23/2011  *RADIOLOGY REPORT*  Clinical Data: 55 year old  male with head and neck cancer status post chemotherapy and radiation completed in 2011.  Restaging.  CT NECK WITH CONTRAST  Technique:  Multidetector CT imaging of the neck was performed with intravenous contrast.  Contrast: OMNIPAQUE IOHEXOL 300 MG/ML  SOLN  Comparison: 02/15/2011 and earlier.  Findings: Continued post XRT changes including diffuse pharyngeal mucosal space thickening.  No definite pharyngeal mass or hyperenhancement.  Other post-treatment changes including atrophy of the salivary glands, small  retropharyngeal effusion, and obscuration of fat planes in the neck.  No residual lymphadenopathy.  Negative thyroid and visualized superior mediastinum.  Major vascular structures remain patent.  Negative visualized orbit and brain soft tissues.  New right maxillary sinus and scattered ethmoid air cell opacification.  Increased left maxillary sinus mucosal thickening. The sphenoid sinuses, tympanic cavities, and mastoids are stable and clear. Stable visualized osseous structures.  Apical pleural and parenchymal scarring in the lungs is stable.  IMPRESSION: 1.  Stable and satisfactory post-therapy appearance of the neck. 2.  Mild to moderate acute maxillary and ethmoid sinus inflammatory changes.  Original Report Authenticated By: Harley Hallmark, M.D.    ASSESSMENT AND PLAN:  1. History of oropharynx squamous cell carcinoma.  I discussed with [name] and his wife that there is no evidence of recurrent or metastatic disease on today's clinical history, physical exam, laboratory tests and imaging modality.  I advised him to continue observation.  He has been refraining from alcohol and smoking cigarettes.  He will have a repeat CT of the neck in 6 months. He will have an annual CXR. 2. History of elevated TSH but normal free T4.  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  Repeat TSH is pending today. He is not symptomatic.  Will continue to monitor free T4 and TSH with future visits.  3. Primary care.  His last colonoscopy was when he turned 52 and they feel that he will be due for it again when he turns 62.   4. Follow In 6 months with CT scan of the neck the day before.  He also has appointments to visit with ENT and Radiation Oncology in between my visits.   The patient was seen and examined with Dr Gaylyn Rong. >90% of plan of care developed by Dr Gaylyn Rong. The length of time of the face-to-face encounter was 20  minutes. More than 50% of time was spent counseling and coordination of care.

## 2011-08-27 ENCOUNTER — Other Ambulatory Visit: Payer: Self-pay | Admitting: Oncology

## 2011-08-27 ENCOUNTER — Encounter: Payer: Self-pay | Admitting: Oncology

## 2011-08-27 DIAGNOSIS — E039 Hypothyroidism, unspecified: Secondary | ICD-10-CM

## 2011-08-27 HISTORY — DX: Hypothyroidism, unspecified: E03.9

## 2011-08-27 MED ORDER — LEVOTHYROXINE SODIUM 50 MCG PO TABS: 50.0000 ug | ORAL_TABLET | Freq: Every day | ORAL | Status: DC

## 2011-08-29 ENCOUNTER — Telehealth: Payer: Self-pay | Admitting: Oncology

## 2011-08-29 NOTE — Telephone Encounter (Signed)
called pt lmovm for lab appts for 06/24 and 08/23. asked pt to rtn call to confirm

## 2011-08-30 ENCOUNTER — Ambulatory Visit: Payer: Managed Care, Other (non HMO) | Admitting: Oncology

## 2011-10-12 ENCOUNTER — Ambulatory Visit
Admission: RE | Admit: 2011-10-12 | Discharge: 2011-10-12 | Disposition: A | Discharge status: Home / Self Care | Payer: Managed Care, Other (non HMO) | Source: Ambulatory Visit | Attending: Radiation Oncology | Admitting: Radiation Oncology

## 2011-10-12 ENCOUNTER — Encounter: Payer: Self-pay | Admitting: Radiation Oncology

## 2011-10-12 VITALS — BP 121/79 | HR 76 | Resp 18 | Wt 134.7 lb

## 2011-10-12 DIAGNOSIS — C109 Malignant neoplasm of oropharynx, unspecified: Secondary | ICD-10-CM

## 2011-10-12 DIAGNOSIS — Z79899 Other long term (current) drug therapy: Secondary | ICD-10-CM | POA: Insufficient documentation

## 2011-10-12 DIAGNOSIS — C099 Malignant neoplasm of tonsil, unspecified: Secondary | ICD-10-CM | POA: Insufficient documentation

## 2011-10-12 MED ORDER — LARYNGOSCOPY SOLUTION RAD-ONC
15.0000 mL | Freq: Once | TOPICAL | Status: AC
  Administered 2011-10-12: 15 mL via TOPICAL
  Filled 2011-10-12: qty 15

## 2011-10-12 NOTE — Progress Notes (Signed)
HERE TODAY FOR FU OF TONSILLAR CA.  NO C/O PROBLEMS. SWALLOWING FINE AND NO C/O PAIN.  STILL HAVING SOME DRY MOUTH.  HAD CT SCAN ON 08/23/11

## 2011-10-13 DIAGNOSIS — C090 Malignant neoplasm of tonsillar fossa: Secondary | ICD-10-CM | POA: Insufficient documentation

## 2011-10-13 NOTE — Progress Notes (Signed)
  Radiation Oncology         ([phone]) [phone] ________________________________  Name: Kevin [name] MRN: [id number]  Date: 10/13/2011  DOB: April 25, 1956  Follow-Up Visit Note  CC: Aura Dials, MD, MD  Exie Parody, MD  Diagnosis:   Carcinoma of the tonsil, T1 CN2C M0  Interval Since Last Radiation:  16 months   Narrative:  The patient returns today for routine follow-up.  He indicates that he has been very stable and he has no new complaints. He denies any dysphasia or odynophagia and noted new pain in the head neck region. He is continuing to complain of some xerostomia. Overall he feels that this has somewhat improved in recent months. The patient did have a CT scan of the neck in April and this looked stable with no signs of recurrent or progressive disease.                              ALLERGIES:   has no known allergies.  Meds: Current Outpatient Prescriptions  Medication Sig Dispense Refill  . levothyroxine (SYNTHROID) 50 MCG tablet Take 1 tablet (50 mcg total) by mouth daily.  30 tablet  5   Current Facility-Administered Medications  Medication Dose Route Frequency Provider Last Rate Last Dose  . laryngocopy solution for Rad-Onc  15 mL Topical Once Jonna Coup, MD   15 mL at 10/12/11 1639    Physical Findings: The patient is in no acute distress. Patient is alert and oriented.  weight is 134 lb 11.2 oz (61.1 kg). His blood pressure is 121/79 and his pulse is 76. His respiration is 18. .   General: Well-developed, in no acute distress HEENT: Normocephalic, atraumatic; oral cavity clear Neck: Supple without any lymphadenopathy Cardiovascular: Regular rate and rhythm Respiratory: Clear to auscultation bilaterally GI: Soft, nontender, normal bowel sounds Extremities: No edema present Neuro: No focal deficits  Fiberoptic exam After the use of topical anesthetic a scope was attempted to be passed through the right near. There was some swelling/ mucus and we therefore switched  to the left. Good visualization was then obtained and there were no suspicious findings within the larynx oropharynx or hypopharynx and the base of tongue region looked very good.  Lab Findings: Lab Results  Component Value Date   WBC 3.3* 08/24/2011   HGB 13.0 08/24/2011   HCT 38.5 08/24/2011   MCV 86.7 08/24/2011   PLT 198 08/24/2011     Radiographic Findings: No results found.  Impression:    Pleasant 56 year old male doing satisfactorily with regards to his tonsillar cancer and he remains clinically NED at this time  Plan:  Followup in 6 months.  I spent 20 minutes with the patient today, the majority of which was spent counseling the patient on the diagnosis of cancer and coordinating care.   Radene Gunning, M.D., Ph.D.

## 2011-10-29 ENCOUNTER — Other Ambulatory Visit (HOSPITAL_BASED_OUTPATIENT_CLINIC_OR_DEPARTMENT_OTHER): Payer: Managed Care, Other (non HMO) | Admitting: Lab

## 2011-10-29 DIAGNOSIS — E039 Hypothyroidism, unspecified: Secondary | ICD-10-CM

## 2011-10-29 LAB — TSH: TSH: 2.175 u[IU]/mL (ref 0.350–4.500)

## 2011-10-30 ENCOUNTER — Telehealth: Payer: Self-pay | Admitting: Oncology

## 2011-10-30 NOTE — Telephone Encounter (Signed)
Message copied by Myrtis Ser on Tue Oct 30, 2011  8:38 AM ------      Message from: HA, Raliegh Ip T      Created: Mon Oct 29, 2011  9:59 PM       Pleae call pt.  His TSH is within normal range now. His hypothyrioidism is now corrected.  Please continue Syntrhoid at some dose.  Thanks.

## 2011-10-30 NOTE — Telephone Encounter (Signed)
Notified of normal TSH. Continue same dose of Synthroid. Pt states understanding.

## 2011-11-19 NOTE — Progress Notes (Signed)
Received office notes from Dr. David Shoemaker @ [facility]; forwarded to Dr. Ha. 

## 2011-12-27 ENCOUNTER — Telehealth: Payer: Self-pay | Admitting: Oncology

## 2011-12-27 NOTE — Telephone Encounter (Signed)
Pt called and r/s labs to next week 01/01/12

## 2011-12-28 ENCOUNTER — Other Ambulatory Visit: Payer: Managed Care, Other (non HMO) | Admitting: Lab

## 2012-01-01 ENCOUNTER — Other Ambulatory Visit (HOSPITAL_BASED_OUTPATIENT_CLINIC_OR_DEPARTMENT_OTHER): Payer: Managed Care, Other (non HMO) | Admitting: Lab

## 2012-01-01 DIAGNOSIS — E039 Hypothyroidism, unspecified: Secondary | ICD-10-CM

## 2012-01-01 LAB — TSH: TSH: 3.259 u[IU]/mL (ref 0.350–4.500)

## 2012-01-02 ENCOUNTER — Telehealth: Payer: Self-pay | Admitting: *Deleted

## 2012-01-02 NOTE — Telephone Encounter (Signed)
Message copied by Wende Mott on Wed Jan 02, 2012  4:16 PM ------      Message from: HA, Raliegh Ip T      Created: Wed Jan 02, 2012  8:27 AM       Please call pt and advise him to continue Synthroid at current dose.  Thanks.

## 2012-01-02 NOTE — Telephone Encounter (Signed)
Called Kevin [name] to inform TSH wnl and continue Synthroid current dose per Dr. Gaylyn Rong.  Kevin [name] confirms taking 50 mcg daily and confirmed his next appt for CT on 10/16 and office visit on 10/17.

## 2012-01-13 NOTE — Progress Notes (Signed)
Not applicable.  Before EPIC gone live.  

## 2012-02-07 ENCOUNTER — Telehealth: Payer: Self-pay | Admitting: Oncology

## 2012-02-07 NOTE — Telephone Encounter (Signed)
called pt back as he needed to verify appts made,done    aom

## 2012-02-20 ENCOUNTER — Ambulatory Visit (HOSPITAL_COMMUNITY)
Admission: RE | Admit: 2012-02-20 | Discharge: 2012-02-20 | Disposition: A | Discharge status: Home / Self Care | Payer: Managed Care, Other (non HMO) | Source: Ambulatory Visit | Attending: Oncology | Admitting: Oncology

## 2012-02-20 DIAGNOSIS — C109 Malignant neoplasm of oropharynx, unspecified: Secondary | ICD-10-CM | POA: Insufficient documentation

## 2012-02-20 DIAGNOSIS — Z9221 Personal history of antineoplastic chemotherapy: Secondary | ICD-10-CM | POA: Insufficient documentation

## 2012-02-20 DIAGNOSIS — M4802 Spinal stenosis, cervical region: Secondary | ICD-10-CM | POA: Insufficient documentation

## 2012-02-20 DIAGNOSIS — R911 Solitary pulmonary nodule: Secondary | ICD-10-CM | POA: Insufficient documentation

## 2012-02-20 DIAGNOSIS — I77819 Aortic ectasia, unspecified site: Secondary | ICD-10-CM | POA: Insufficient documentation

## 2012-02-20 DIAGNOSIS — Z923 Personal history of irradiation: Secondary | ICD-10-CM | POA: Insufficient documentation

## 2012-02-20 MED ORDER — IOHEXOL 300 MG/ML  SOLN
100.0000 mL | Freq: Once | INTRAMUSCULAR | Status: AC | PRN
  Administered 2012-02-20: 100 mL via INTRAVENOUS

## 2012-02-21 ENCOUNTER — Ambulatory Visit (HOSPITAL_BASED_OUTPATIENT_CLINIC_OR_DEPARTMENT_OTHER): Payer: Managed Care, Other (non HMO) | Admitting: Oncology

## 2012-02-21 ENCOUNTER — Other Ambulatory Visit (HOSPITAL_BASED_OUTPATIENT_CLINIC_OR_DEPARTMENT_OTHER): Payer: Managed Care, Other (non HMO) | Admitting: Lab

## 2012-02-21 ENCOUNTER — Telehealth: Payer: Self-pay | Admitting: Oncology

## 2012-02-21 VITALS — BP 135/85 | HR 66 | Temp 97.0°F | Resp 20 | Ht 66.0 in | Wt 132.7 lb

## 2012-02-21 DIAGNOSIS — C109 Malignant neoplasm of oropharynx, unspecified: Secondary | ICD-10-CM

## 2012-02-21 DIAGNOSIS — E039 Hypothyroidism, unspecified: Secondary | ICD-10-CM

## 2012-02-21 LAB — COMPREHENSIVE METABOLIC PANEL (CC13)
ALT: 12 U/L (ref 0–55)
AST: 15 U/L (ref 5–34)
Albumin: 3.8 g/dL (ref 3.5–5.0)
Alkaline Phosphatase: 78 U/L (ref 40–150)
BUN: 14 mg/dL (ref 7.0–26.0)
CO2: 23 meq/L (ref 22–29)
Calcium: 9.4 mg/dL (ref 8.4–10.4)
Chloride: 99 meq/L (ref 98–107)
Creatinine: 0.9 mg/dL (ref 0.7–1.3)
Glucose: 77 mg/dL (ref 70–99)
Potassium: 4.2 meq/L (ref 3.5–5.1)
Sodium: 132 meq/L — ABNORMAL LOW (ref 136–145)
Total Bilirubin: 0.6 mg/dL (ref 0.20–1.20)
Total Protein: 6.3 g/dL — ABNORMAL LOW (ref 6.4–8.3)

## 2012-02-21 LAB — CBC WITH DIFFERENTIAL/PLATELET
BASO%: 0.7 % (ref 0.0–2.0)
Basophils Absolute: 0 10*3/uL (ref 0.0–0.1)
EOS%: 1.5 % (ref 0.0–7.0)
HGB: 13.8 g/dL (ref 13.0–17.1)
MCH: 29.1 pg (ref 27.2–33.4)
MCV: 83.8 fL (ref 79.3–98.0)
MONO%: 12.2 % (ref 0.0–14.0)
NEUT#: 3.5 10*3/uL (ref 1.5–6.5)
RBC: 4.74 10*6/uL (ref 4.20–5.82)
RDW: 13 % (ref 11.0–14.6)
lymph#: 0.4 10*3/uL — ABNORMAL LOW (ref 0.9–3.3)

## 2012-02-21 LAB — T4, FREE: Free T4: 1.26 ng/dL (ref 0.80–1.80)

## 2012-02-21 LAB — TSH: TSH: 3.082 u[IU]/mL (ref 0.350–4.500)

## 2012-02-21 MED ORDER — LEVOTHYROXINE SODIUM 50 MCG PO TABS: 50.0000 ug | ORAL_TABLET | Freq: Every day | ORAL | Status: DC

## 2012-02-21 NOTE — Telephone Encounter (Signed)
appts made and printed for pt aom °

## 2012-02-21 NOTE — Patient Instructions (Addendum)
1.  Diagnosis:  History of head and neck cancer. 2.  No evidence of disease on neck CT.  CT NECK WITH CONTRAST  Technique: Multidetector CT imaging of the neck was performed with  intravenous contrast.  Contrast: OMNIPAQUE IOHEXOL 300 MG/ML SOLN  Comparison: Most recent examination 42 12/25/2011.  Findings: Post-therapy type changes involving the neck appear  similar to the prior examination including the asymmetric  appearance of the posterior-superior nasopharyngeal soft tissue,  retropharyngeal edema and infiltration of fat planes as well as  slight asymmetry of the glottic region.  No adenopathy.  Stable appearance of lung apical parenchymal changes.  4.3 mm nodule appears relatively similar to the prior exams.  Attention to this on follow-up.  Ectatic thoracic aorta.  Degenerative changes cervical spine with various degrees of spinal  stenosis and foraminal narrowing.  Lucency in the mandible and alveolar ridge unchanged.  Progressive paranasal sinus opacification now with complete  opacification left maxillary sinus and almost complete  opacification right maxillary sinus with mucosal thickening ethmoid  sinus air cells bilaterally.   IMPRESSION:   Stable post therapy exam without findings of recurrent tumor or  adenopathy.  Progressive paranasal sinus opacification.  4.3 mm right upper lobe nodule appears relatively similar to the  prior exams. Attention to this on follow-up.   C.  Follow up:  In about 6 months with chest X-ray same day.

## 2012-02-22 NOTE — Progress Notes (Signed)
[facility]  Telephone:([phone] Fax:(336) [phone]   OFFICE PROGRESS NOTE   Cc:  BOUSKA,DAVID E, MD  DIAGNOSIS: History of a cT4N2cM0 invasive squamous cell carcinoma.   PAST THERAPY: Induction chemotherapy (cisplatin/5FU/Taxotere) between December 29, 2009 and 03/02/10. He was on concurrent weekly Carboplatin and daily XRT on 04/06/2010. He finished weekly Carboplatin on 05/11/2010 and XRT on 05/25/2010.   CURRENT THERAPY: Watchful observation.  INTERVAL HISTORY: Kevin [name] 56 y.o. male returns for routine follow up with his wife.  He reports feeling well.  He has mild xerostomia which has much improved compared to last year.  He denied dysphagia, odynophagia, hearing loss, tinnitus, neck node swelling, hearing loss, hoarse voice, fever, fatigue, head ache, bone pain, cough, SOB, chest pain, bleeding symptoms, bone pain, leg weakness, paresthesia.  He works full time without fatigue.    Past Medical History  Diagnosis Date  . Oropharynx cancer   . Cancer 11/20/2009    tonsil  . Hx of radiation therapy 04/05/10 to 05/25/10  . Hypothyroid 08/27/2011    Past Surgical History  Procedure Date  . Appendectomy     childhood    Current Outpatient Prescriptions  Medication Sig Dispense Refill  . DENTA 5000 PLUS 1.1 % CREA dental cream Use as directed      . fluticasone (FLONASE) 50 MCG/ACT nasal spray Place 50 mcg into the nose. Use as directed      . levothyroxine (SYNTHROID) 50 MCG tablet Take 1 tablet (50 mcg total) by mouth daily.  90 tablet  3    ALLERGIES:   has no known allergies.  REVIEW OF SYSTEMS:  The rest of the 14-point review of system was negative.   Filed Vitals:   02/21/12 1152  BP: 135/85  Pulse: 66  Temp: 97 F (36.1 C)  Resp: 20   Wt Readings from Last 3 Encounters:  02/21/12 132 lb 11.2 oz (60.192 kg)  10/12/11 134 lb 11.2 oz (61.1 kg)  08/24/11 130 lb 14.4 oz (59.376 kg)   ECOG Performance status: 0  PHYSICAL EXAMINATION:    General:  Thin-appearing man, in no acute distress.  Eyes:  no scleral icterus.  ENT:  There were no oropharyngeal lesions.  Neck was without thyromegaly.  Lymphatics:  Negative cervical, supraclavicular or axillary adenopathy.  Respiratory: lungs were clear bilaterally without wheezing or crackles.  Cardiovascular:  Regular rate and rhythm, S1/S2, without murmur, rub or gallop.  There was no pedal edema.  GI:  abdomen was soft, flat, nontender, nondistended, without organomegaly.  Muscoloskeletal:  no spinal tenderness of palpation of vertebral spine.  Skin exam was without echymosis, petichae.  Neuro exam was nonfocal.  Patient was able to get on and off exam table without assistance.  Gait was normal.  Patient was alerted and oriented.  Attention was good.   Language was appropriate.  Mood was normal without depression.  Speech was not pressured.  Thought content was not tangential.    LABORATORY/RADIOLOGY DATA:  Lab Results  Component Value Date   WBC 4.6 02/21/2012   HGB 13.8 02/21/2012   HCT 39.7 02/21/2012   PLT 178 02/21/2012   GLUCOSE 77 02/21/2012   ALKPHOS 78 02/21/2012   ALT 12 02/21/2012   AST 15 02/21/2012   NA 132* 02/21/2012   K 4.2 02/21/2012   CL 99 02/21/2012   CREATININE 0.9 02/21/2012   BUN 14.0 02/21/2012   CO2 23 02/21/2012   INR 0.97 12/27/2009   IMAGING:  I personally  reviewed the following CT neck and showed the images to the patient and his wife.   Ct Soft Tissue Neck W Contrast  02/20/2012  *RADIOLOGY REPORT*  Clinical Data: Oral pharyngeal cancer diagnosed 2011.  Chemotherapy and radiation therapy complete.  No complaints.  CT NECK WITH CONTRAST  Technique:  Multidetector CT imaging of the neck was performed with intravenous contrast.  Contrast: OMNIPAQUE IOHEXOL 300 MG/ML  SOLN  Comparison: Most recent examination 42 12/25/2011.  Findings: Post-therapy type changes involving the neck appear similar to the prior examination including the asymmetric  appearance of the posterior-superior nasopharyngeal soft tissue, retropharyngeal edema and infiltration of fat planes as well as slight asymmetry of the glottic region.  No adenopathy.  Stable appearance of lung apical parenchymal changes.  4.3 mm nodule appears relatively similar to the prior exams. Attention to this on follow-up.  Ectatic thoracic aorta.  Degenerative changes cervical spine with various degrees of spinal stenosis and foraminal narrowing.  Lucency in the mandible and alveolar ridge unchanged.  Progressive paranasal sinus opacification now with complete opacification left maxillary sinus and almost complete opacification right maxillary sinus with mucosal thickening ethmoid sinus air cells bilaterally.  IMPRESSION: Stable post therapy exam without findings of recurrent tumor or adenopathy.  Progressive paranasal sinus opacification.  4.3 mm right upper lobe nodule appears relatively similar to the prior exams.  Attention to this on follow-up.   Original Report Authenticated By: Fuller Canada, M.D.     ASSESSMENT AND PLAN:   1. History of oropharynx squamous cell carcinoma. Continue to be in remission.  Right RUL nodule has been stable with serial CT.  We will continue to have yearly CXR to follow this.  His next CT neck will be in about 6 months.  This will be the last surveillance neck CT since he will be more than 2 years out from the finish of therapy.  I advise him to have continue follow up with ENT and Rad Onc for flexible scope.  If there is any abnormal finding or symptoms, repeat neck CT will be indicated after the next one.  2. Hypothyroidism:  TSH is normal.  He is on Synthroid.  I advised him to continue at PO daily.  3. Age-appropriate cancer screening:  His last colonoscopy was when he turned 52 and they feel that he will be due for it again when he turns 62.  4. Follow In 6 months with CT scan of the neck the day before.     The length of time of the face-to-face  encounter was 15 minutes. More than 50% of time was spent counseling and coordination of care.

## 2012-04-09 ENCOUNTER — Encounter: Payer: Self-pay | Admitting: Radiation Oncology

## 2012-04-10 ENCOUNTER — Ambulatory Visit
Admission: RE | Admit: 2012-04-10 | Discharge: 2012-04-10 | Disposition: A | Discharge status: Home / Self Care | Payer: Managed Care, Other (non HMO) | Source: Ambulatory Visit | Attending: Radiation Oncology | Admitting: Radiation Oncology

## 2012-04-10 ENCOUNTER — Encounter: Payer: Self-pay | Admitting: Radiation Oncology

## 2012-04-10 VITALS — BP 141/84 | HR 60 | Temp 98.2°F | Resp 18 | Wt 129.6 lb

## 2012-04-10 DIAGNOSIS — K117 Disturbances of salivary secretion: Secondary | ICD-10-CM | POA: Insufficient documentation

## 2012-04-10 DIAGNOSIS — C090 Malignant neoplasm of tonsillar fossa: Secondary | ICD-10-CM

## 2012-04-10 DIAGNOSIS — C099 Malignant neoplasm of tonsil, unspecified: Secondary | ICD-10-CM | POA: Insufficient documentation

## 2012-04-10 MED ORDER — LARYNGOSCOPY SOLUTION RAD-ONC
15.0000 mL | Freq: Once | TOPICAL | Status: AC
  Administered 2012-04-10: 15 mL via TOPICAL
  Filled 2012-04-10: qty 15

## 2012-04-10 NOTE — Progress Notes (Signed)
Patient presents to the clinic today accompanied by his wife for follow up with Dr. Mitzi Hansen. Patient alert and oriented to person, place, and time. No distress noted. Steady gait noted. Pleasant affect noted. Patient denies pain at this time. Patient reports dry mouth continues but, is nothing he can't manage. Patient keeps a bottle of water with him at all times. Patient reports foods taste normal. Patient denies sore or ulcerations of the mouth. Patient denies pain or difficulty swallowing. Patient denies nausea or vomiting. Patient denies cough. Patient denies headache or dizziness.  Patient reports a "good appetite." Patient reports there is "nothing he can't eat." Patient reports sleeping well. Patient reports a good/normal energy level. Also, he reports that he continues to work full time. Last scan in April with Dr. Gaylyn Rong. Reported all findings to Dr. Mitzi Hansen.

## 2012-04-11 ENCOUNTER — Ambulatory Visit: Payer: Managed Care, Other (non HMO) | Admitting: Radiation Oncology

## 2012-04-11 NOTE — Progress Notes (Signed)
  Radiation Oncology         (336) [phone] ________________________________  Name: Kevin [name] MRN: [id number]  Date: 04/10/2012  DOB: [birth year]  Follow-Up Visit Note  CC: Willow Ora, MD  Exie Parody, MD  Diagnosis:   Tonsillar cancer  Interval Since Last Radiation:  10 months   Narrative:  The patient returns today for routine follow-up.  The patient states that he is done recently well. Continued xerostomia which is his major complaint. He does keep a bottle of water with him most all of the time. His taste has largely returned to normal. No pain or difficulty with swallowing. He reports a good appetite.                              ALLERGIES:   has no known allergies.  Meds: Current Outpatient Prescriptions  Medication Sig Dispense Refill  . DENTA 5000 PLUS 1.1 % CREA dental cream Use as directed      . levothyroxine (SYNTHROID) 50 MCG tablet Take 1 tablet (50 mcg total) by mouth daily.  90 tablet  3  . fluticasone (FLONASE) 50 MCG/ACT nasal spray Place 50 mcg into the nose. Use as directed       Current Facility-Administered Medications  Medication Dose Route Frequency Provider Last Rate Last Dose  . [COMPLETED] laryngocopy solution for Rad-Onc  15 mL Topical Once Jonna Coup, MD   15 mL at 04/10/12 1325    Physical Findings: The patient is in no acute distress. Patient is alert and oriented.  weight is 129 lb 9.6 oz (58.786 kg). His oral temperature is 98.2 F (36.8 C). His blood pressure is 141/84 and his pulse is 60. His respiration is 18. .   General: Well-developed, in no acute distress HEENT: Normocephalic, atraumatic No cervical lymphadenopathy, oral cavity clear Cardiovascular: Regular rate and rhythm Respiratory: Clear to auscultation bilaterally GI: Soft, nontender, normal bowel sounds Extremities: No edema present  Fiberoptic exam: After the use of topical anesthetic, the flexible laryngoscope was passed through the right near. Good visualization was  obtained. No lesions or suspicious findings within the larynx, hypopharynx, oropharynx or nasopharynx.    Lab Findings: Lab Results  Component Value Date   WBC 4.6 02/21/2012   HGB 13.8 02/21/2012   HCT 39.7 02/21/2012   MCV 83.8 02/21/2012   PLT 178 02/21/2012     Radiographic Findings: No results found.  Impression:    The patient is a 56 year old male remains clinically NED from head and neck cancer.  Plan:  Six-month followup  I spent 15 minutes with the patient today, the majority of which was spent counseling the patient on the diagnosis of cancer and coordinating care.   Radene Gunning, M.D., Ph.D.

## 2012-04-21 ENCOUNTER — Encounter: Payer: Self-pay | Admitting: Internal Medicine

## 2012-04-21 ENCOUNTER — Ambulatory Visit (INDEPENDENT_AMBULATORY_CARE_PROVIDER_SITE_OTHER): Payer: Managed Care, Other (non HMO) | Admitting: Internal Medicine

## 2012-04-21 VITALS — BP 138/84 | HR 63 | Temp 97.4°F | Ht 67.5 in | Wt 129.0 lb

## 2012-04-21 DIAGNOSIS — Z0000 Encounter for general adult medical examination without abnormal findings: Secondary | ICD-10-CM

## 2012-04-21 DIAGNOSIS — R911 Solitary pulmonary nodule: Secondary | ICD-10-CM

## 2012-04-21 DIAGNOSIS — E039 Hypothyroidism, unspecified: Secondary | ICD-10-CM

## 2012-04-21 HISTORY — DX: Solitary pulmonary nodule: R91.1

## 2012-04-21 NOTE — Progress Notes (Signed)
  Subjective:    Patient ID: Kevin [name], male    DOB: 1955-11-07, 56 y.o.   MRN: [id number]  HPI CPX, new patient  Past Medical History  Diagnosis Date  . 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 Hx of radiation therapy 04/05/10 to 05/25/10    head/neck  . Hypothyroid 08/27/2011  . Pulmonary nodule 04/21/2012   Past Surgical History  Procedure Date  . Appendectomy     childhood  . Neck lesion biopsy 2011     Dx w/ tonsil cancer    History   Social History  . Marital Status: Married    Spouse Name: N/A    Number of Children: 4  . Years of Education: N/A   Occupational History  . Retail banker  [employer]   Social History Main Topics  . Smoking status: Never Smoker   . Smokeless tobacco: Never Used  . Alcohol Use: Yes     Comment: occassionally  . Drug Use: No  . Sexually Active: Not on file   Other Topics Concern  . Not on file   Social History Narrative   Married, 4 children, 3 living --- diet: healthy most of the time --- exercise: active at work, exercises some days    Family History  Problem Relation Age of Onset  . Stroke Father     dx in his 38s  . CAD Neg Hx   . Colon cancer Neg Hx   . Prostate cancer Neg Hx   . Diabetes Neg Hx      Review of Systems Denies chest pain or shortness of breath No nausea, vomiting, diarrhea. No dysuria or gross hematuria. No anxiety or depression. Status of radiation therapy in the neck, needs to use a special tooth paste, also have some dry mouth    Objective:   Physical Exam General -- alert, well-developed, BMI ~19   Lungs -- normal respiratory effort, no intercostal retractions, no accessory muscle use, and normal breath sounds.   Heart-- normal rate, regular rhythm, no murmur, and no gallop.   Abdomen--soft, non-tender, no distention, no masses, no HSM, no guarding, and no rigidity.   Extremities-- no pretibial edema bilaterally Rectal-- No external abnormalities noted. Normal sphincter tone. No rectal  masses or tenderness. Brown stool  Prostate:  Prostate gland firm and smooth, no enlargement, nodularity, tenderness, mass, asymmetry or induration. Neurologic-- alert & oriented X3 and strength normal in all extremities. Psych-- Cognition and judgment appear intact. Alert and cooperative with normal attention span and concentration.  not anxious appearing and not depressed appearing.       Assessment & Plan:

## 2012-04-21 NOTE — Patient Instructions (Signed)
Please come back fasting: FLP, PSA, BMP--- dx v70 ----- Please get records from your previous MD:  Labs, x-rays, EKGs, copies of the  colonoscopy, the last few office visit notes

## 2012-04-21 NOTE — Assessment & Plan Note (Addendum)
Td < 10 years ago per pt Declined flu shot , explained benefits  cscope 08-2008, Dr Russella Dar was (-), next  in 10 year BMI is below 20, recommend good nutrition and stay active. Labs from 02-2012 reviewed. Will check FLP, PSA, BMP Will asked to get records from previous PCP Needs paperwork for his job--> done

## 2012-04-21 NOTE — Assessment & Plan Note (Signed)
Last TSH okay, return to the office in 6 months

## 2012-04-22 ENCOUNTER — Other Ambulatory Visit (INDEPENDENT_AMBULATORY_CARE_PROVIDER_SITE_OTHER): Payer: Managed Care, Other (non HMO)

## 2012-04-22 DIAGNOSIS — Z0000 Encounter for general adult medical examination without abnormal findings: Secondary | ICD-10-CM

## 2012-04-23 LAB — BASIC METABOLIC PANEL
BUN: 16 mg/dL (ref 6–23)
CO2: 26 mEq/L (ref 19–32)
Calcium: 9.4 mg/dL (ref 8.4–10.5)
Creatinine, Ser: 1.1 mg/dL (ref 0.4–1.5)
GFR: 73.94 mL/min (ref >60.00)
Glucose, Bld: 92 mg/dL (ref 70–99)

## 2012-04-23 LAB — LIPID PANEL
HDL: 67.7 mg/dL (ref >39.00)
LDL Cholesterol: 98 mg/dL (ref 0–99)
Total CHOL/HDL Ratio: 3
Triglycerides: 44 mg/dL (ref 0.0–149.0)

## 2012-06-21 ENCOUNTER — Other Ambulatory Visit: Payer: Self-pay

## 2012-08-18 ENCOUNTER — Telehealth: Payer: Self-pay | Admitting: Oncology

## 2012-08-18 NOTE — Telephone Encounter (Signed)
returned pt called and advised that i was able to r/s appt around ct

## 2012-08-20 ENCOUNTER — Other Ambulatory Visit (HOSPITAL_COMMUNITY): Payer: Managed Care, Other (non HMO)

## 2012-08-20 ENCOUNTER — Other Ambulatory Visit: Payer: Managed Care, Other (non HMO) | Admitting: Lab

## 2012-08-20 ENCOUNTER — Ambulatory Visit (HOSPITAL_COMMUNITY): Payer: Managed Care, Other (non HMO)

## 2012-08-21 ENCOUNTER — Ambulatory Visit: Payer: Managed Care, Other (non HMO) | Admitting: Oncology

## 2012-08-21 ENCOUNTER — Other Ambulatory Visit (HOSPITAL_BASED_OUTPATIENT_CLINIC_OR_DEPARTMENT_OTHER): Payer: Managed Care, Other (non HMO) | Admitting: Lab

## 2012-08-21 ENCOUNTER — Ambulatory Visit (HOSPITAL_COMMUNITY)
Admission: RE | Admit: 2012-08-21 | Discharge: 2012-08-21 | Disposition: A | Discharge status: Home / Self Care | Payer: Managed Care, Other (non HMO) | Source: Ambulatory Visit | Attending: Oncology | Admitting: Oncology

## 2012-08-21 DIAGNOSIS — C109 Malignant neoplasm of oropharynx, unspecified: Secondary | ICD-10-CM

## 2012-08-21 DIAGNOSIS — Z85819 Personal history of malignant neoplasm of unspecified site of lip, oral cavity, and pharynx: Secondary | ICD-10-CM | POA: Insufficient documentation

## 2012-08-21 DIAGNOSIS — Z9221 Personal history of antineoplastic chemotherapy: Secondary | ICD-10-CM | POA: Insufficient documentation

## 2012-08-21 DIAGNOSIS — M47812 Spondylosis without myelopathy or radiculopathy, cervical region: Secondary | ICD-10-CM | POA: Insufficient documentation

## 2012-08-21 DIAGNOSIS — J320 Chronic maxillary sinusitis: Secondary | ICD-10-CM | POA: Insufficient documentation

## 2012-08-21 LAB — COMPREHENSIVE METABOLIC PANEL (CC13)
ALT: 11 U/L (ref 0–55)
AST: 14 U/L (ref 5–34)
Alkaline Phosphatase: 86 U/L (ref 40–150)
BUN: 13.2 mg/dL (ref 7.0–26.0)
Calcium: 9.5 mg/dL (ref 8.4–10.4)
Chloride: 103 mEq/L (ref 98–107)
Creatinine: 1.3 mg/dL (ref 0.7–1.3)
Potassium: 4 mEq/L (ref 3.5–5.1)

## 2012-08-21 LAB — CBC WITH DIFFERENTIAL/PLATELET
BASO%: 1.1 % (ref 0.0–2.0)
Basophils Absolute: 0.1 10*3/uL (ref 0.0–0.1)
EOS%: 2 % (ref 0.0–7.0)
MCH: 29.1 pg (ref 27.2–33.4)
MCHC: 33.6 g/dL (ref 32.0–36.0)
MCV: 86.6 fL (ref 79.3–98.0)
MONO%: 13.2 % (ref 0.0–14.0)
NEUT%: 70.9 % (ref 39.0–75.0)
RDW: 14.1 % (ref 11.0–14.6)
lymph#: 0.7 10*3/uL — ABNORMAL LOW (ref 0.9–3.3)

## 2012-08-21 MED ORDER — IOHEXOL 300 MG/ML  SOLN
100.0000 mL | Freq: Once | INTRAMUSCULAR | Status: AC | PRN
  Administered 2012-08-21: 100 mL via INTRAVENOUS

## 2012-08-22 ENCOUNTER — Telehealth: Payer: Self-pay | Admitting: Oncology

## 2012-08-22 ENCOUNTER — Encounter: Payer: Self-pay | Admitting: Oncology

## 2012-08-22 ENCOUNTER — Ambulatory Visit (HOSPITAL_BASED_OUTPATIENT_CLINIC_OR_DEPARTMENT_OTHER): Payer: Managed Care, Other (non HMO) | Admitting: Oncology

## 2012-08-22 VITALS — BP 134/88 | HR 66 | Temp 96.6°F | Resp 18 | Ht 67.5 in | Wt 134.7 lb

## 2012-08-22 DIAGNOSIS — E039 Hypothyroidism, unspecified: Secondary | ICD-10-CM

## 2012-08-22 DIAGNOSIS — C109 Malignant neoplasm of oropharynx, unspecified: Secondary | ICD-10-CM

## 2012-08-22 NOTE — Progress Notes (Signed)
[facility]  Telephone:([phone]) [phone] Fax:([phone]) [phone]   OFFICE PROGRESS NOTE   Cc:  Willow Ora, MD  DIAGNOSIS: History of a [id number] invasive squamous cell carcinoma.   PAST THERAPY: Induction chemotherapy (cisplatin/5FU/Taxotere) between December 29, 2009 and 03/02/10. He was on concurrent weekly Carboplatin and daily XRT on 04/06/2010. He finished weekly Carboplatin on 05/11/2010 and XRT on 05/25/2010.   CURRENT THERAPY: Watchful observation.  INTERVAL HISTORY: Kevin [name] 57 y.o. male returns for routine follow up by himself.  He reports feeling well.  He has mild xerostomia which has much improved compared to last year.  He denied dysphagia, odynophagia, hearing loss, tinnitus, neck node swelling, hearing loss, hoarse voice, fever, fatigue, head ache, bone pain, cough, SOB, chest pain, bleeding symptoms, bone pain, leg weakness, paresthesia.  He works full time without fatigue.    Past Medical History  Diagnosis Date  . 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 Hx of radiation therapy 04/05/10 to 05/25/10    head/neck  . Hypothyroid 08/27/2011  . Pulmonary nodule 04/21/2012    Past Surgical History  Procedure Laterality Date  . Appendectomy      childhood  . Neck lesion biopsy  2011     Dx w/ tonsil cancer     Current Outpatient Prescriptions  Medication Sig Dispense Refill  . levothyroxine (SYNTHROID) 50 MCG tablet Take 1 tablet (50 mcg total) by mouth daily.  90 tablet  3   No current facility-administered medications for this visit.    ALLERGIES:  has No Known Allergies.  REVIEW OF SYSTEMS:  The rest of the 14-point review of system was negative.   Filed Vitals:   08/22/12 1349  BP: 134/88  Pulse: 66  Temp: 96.6 F (35.9 C)  Resp: 18   Wt Readings from Last 3 Encounters:  08/22/12 134 lb 11.2 oz (61.1 kg)  04/21/12 129 lb (58.514 kg)  04/10/12 129 lb 9.6 oz (58.786 kg)   ECOG Performance status: 0  PHYSICAL EXAMINATION:   General:   Thin-appearing man, in no acute distress.  Eyes:  no scleral icterus.  ENT:  There were no oropharyngeal lesions.  Neck was without thyromegaly.  Lymphatics:  Negative cervical, supraclavicular or axillary adenopathy.  Respiratory: lungs were clear bilaterally without wheezing or crackles.  Cardiovascular:  Regular rate and rhythm, S1/S2, without murmur, rub or gallop.  There was no pedal edema.  GI:  abdomen was soft, flat, nontender, nondistended, without organomegaly.  Muscoloskeletal:  no spinal tenderness of palpation of vertebral spine.  Skin exam was without echymosis, petichae.  Neuro exam was nonfocal.  Patient was able to get on and off exam table without assistance.  Gait was normal.  Patient was alerted and oriented.  Attention was good.   Language was appropriate.  Mood was normal without depression.  Speech was not pressured.  Thought content was not tangential.    LABORATORY/RADIOLOGY DATA:  Lab Results  Component Value Date   WBC 5.3 08/21/2012   HGB 14.7 08/21/2012   HCT 43.9 08/21/2012   PLT 196 08/21/2012   GLUCOSE 102* 08/21/2012   CHOL 174 04/22/2012   TRIG 44.0 04/22/2012   HDL 67.70 04/22/2012   LDLCALC 98 04/22/2012   ALKPHOS 86 08/21/2012   ALT 11 08/21/2012   AST 14 08/21/2012   NA 138 08/21/2012   K 4.0 08/21/2012   CL 103 08/21/2012   CREATININE 1.3 08/21/2012   BUN 13.2 08/21/2012   CO2 23 08/21/2012  PSA 1.06 04/22/2012   INR 0.97 12/27/2009   IMAGING:   CT NECK WITH CONTRAST  Technique: Multidetector CT imaging of the neck was performed with  intravenous contrast.  Contrast: OMNIPAQUE IOHEXOL 300 MG/ML SOLN  Comparison: Most recent 02/20/2012. Also 08/23/2011.  Findings: No evidence for oropharyngeal recurrence. No pathologic  adenopathy. Stable small hyperenhancing salivary glands.  Nonstenotic atheromatous change both carotid bifurcations.  Cervical spondylosis. Lung apex lesion not seen today; cuts did  not go far enough inferior.  Significant  bilateral maxillary sinus disease persists over the 12-  month interval. Central hyperattenuation of the turbinates and  intrasinus material could suggest allergic fungal sinusitis.  Correlate clinically. Bilateral ethmoid disease may be minimally  improved. Negative intracranial compartment.  IMPRESSION:  No evidence for recurrent oropharyngeal cancer. No mucosal lesion  or apparent pathologic adenopathy.  Post XRT changes described.  Unable to evaluate right upper lobe lesion as the scan did not  extend through this area.  Original Report Authenticated By: Davonna Belling, M.D.  CHEST - 2 VIEW  Comparison: 08/23/2011  Findings: The lung fields are mildly hyperexpanded and this finding  is stable. Heart and mediastinal contours are within normal  limits. The lung fields appear clear with no signs of focal  infiltrate or congestive failure. No pleural fluid or significant  peribronchial cuffing is seen. No pulmonary nodularity is  identified to suggest the presence of pulmonary metastatic disease.  Bony structures appear intact.  IMPRESSION:  Stable mild hyperinflation with no new focal or acute abnormality  seen. No findings to suggest pulmonary metastatic disease seen  Original Report Authenticated By: Rhodia Albright, M.D.   ASSESSMENT AND PLAN:   1. History of oropharynx squamous cell carcinoma. Continue to be in remission.  Right RUL nodule has been stable. We will continue to have yearly CXR to follow this. No routine surveillance CT scan is indicated since he is over 2 years out from diagnosis.  I advised him to have continue follow up with ENT for flexible scope.  If there is any abnormal finding or symptoms, repeat neck CT will be indicated.  2. Hypothyroidism:  TSH is normal.  He is on Synthroid.  I advised him to continue at PO daily.  3. Age-appropriate cancer screening:  His last colonoscopy was when he turned 52 and they feel that he will be due for it again when he  turns 62.  4. Follow In 1 year with annual CXR.     The length of time of the face-to-face encounter was 30 minutes. More than 50% of time was spent counseling and coordination of care.

## 2012-08-22 NOTE — Telephone Encounter (Signed)
gv and printed pt appt sched and avs for April 2015.Marland KitchenMarland Kitchenpt aware to go to radiology at nxt visit

## 2012-10-09 ENCOUNTER — Ambulatory Visit: Payer: Managed Care, Other (non HMO) | Admitting: Radiation Oncology

## 2012-10-20 ENCOUNTER — Encounter: Payer: Self-pay | Admitting: Internal Medicine

## 2012-10-20 ENCOUNTER — Ambulatory Visit (INDEPENDENT_AMBULATORY_CARE_PROVIDER_SITE_OTHER): Payer: Managed Care, Other (non HMO) | Admitting: Internal Medicine

## 2012-10-20 VITALS — BP 132/84 | HR 56 | Temp 98.1°F | Wt 131.0 lb

## 2012-10-20 DIAGNOSIS — C109 Malignant neoplasm of oropharynx, unspecified: Secondary | ICD-10-CM

## 2012-10-20 DIAGNOSIS — E039 Hypothyroidism, unspecified: Secondary | ICD-10-CM

## 2012-10-20 NOTE — Assessment & Plan Note (Signed)
Good medication compliance, chart reviewed, last TSH 3.0. Energy level is good. Plan: No change

## 2012-10-20 NOTE — Patient Instructions (Addendum)
Next visit in 6 to 8 months for a physical

## 2012-10-20 NOTE — Assessment & Plan Note (Signed)
Recently saw oncology, felt to be stable, followup with oncology next year with a chest x-ray

## 2012-10-20 NOTE — Progress Notes (Signed)
  Subjective:    Patient ID: Kevin [name], male    DOB: 01-07-1956, 57 y.o.   MRN: [id number]  HPI Six-month followup Hypothyroidism, good medication compliance. H/o oropharynx cancer, saw oncology, note reviewed, he was felt to be stable.  Past Medical History  Diagnosis Date  . 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 Hx of radiation therapy 04/05/10 to 05/25/10    head/neck  . Hypothyroid 08/27/2011  . Pulmonary nodule 04/21/2012   Past Surgical History  Procedure Laterality Date  . Appendectomy      childhood  . Neck lesion biopsy  2011     Dx w/ tonsil cancer      Review of Systems No chest shortness or breath No anxiety or depression per se. Denies fatigue or feeling sleepy.     Objective:   Physical Exam BP 132/84  Pulse 56  Temp(Src) 98.1 F (36.7 C) (Oral)  Wt 131 lb (59.421 kg)  BMI 20.2 kg/m2  SpO2 99%  General -- alert, well-developed, NAD Lungs -- normal respiratory effort, no intercostal retractions, no accessory muscle use, and normal breath sounds.   Heart-- normal rate, regular rhythm, no murmur, and no gallop.    Neurologic-- alert & oriented X3 and strength normal in all extremities. Psych-- Cognition and judgment appear intact. Alert and cooperative with normal attention span and concentration.  not anxious appearing and not depressed appearing.       Assessment & Plan:

## 2013-01-29 ENCOUNTER — Encounter: Payer: Self-pay | Admitting: *Deleted

## 2013-01-29 ENCOUNTER — Other Ambulatory Visit: Payer: Self-pay | Admitting: *Deleted

## 2013-01-29 DIAGNOSIS — E039 Hypothyroidism, unspecified: Secondary | ICD-10-CM

## 2013-01-29 MED ORDER — LEVOTHYROXINE SODIUM 50 MCG PO TABS: 50.0000 ug | ORAL_TABLET | Freq: Every day | ORAL | Status: DC

## 2013-03-12 ENCOUNTER — Other Ambulatory Visit: Payer: Self-pay

## 2013-04-27 ENCOUNTER — Telehealth: Payer: Self-pay

## 2013-04-27 NOTE — Telephone Encounter (Addendum)
Patient will CB. On business call  Medication and allergies:  Reviewed and updated  90 day supply/mail order: na Local pharmacy: [facility] [address]   Immunizations due:  Declines flu vaccine  A/P:   No changes to FH or PSH or personal hx Tdap--< 10 years ago per patient CCS--08/2008--Dr Stark--neg--next due 2020 PSA--04/2012--1.06  To Discuss with Provider: Marybelle Killings for CPE

## 2013-04-28 ENCOUNTER — Encounter: Payer: Self-pay | Admitting: Internal Medicine

## 2013-04-28 ENCOUNTER — Ambulatory Visit (INDEPENDENT_AMBULATORY_CARE_PROVIDER_SITE_OTHER): Payer: Managed Care, Other (non HMO) | Admitting: Internal Medicine

## 2013-04-28 VITALS — BP 142/76 | HR 62 | Temp 98.5°F | Ht 67.0 in | Wt 135.0 lb

## 2013-04-28 DIAGNOSIS — E039 Hypothyroidism, unspecified: Secondary | ICD-10-CM

## 2013-04-28 DIAGNOSIS — Z0000 Encounter for general adult medical examination without abnormal findings: Secondary | ICD-10-CM

## 2013-04-28 DIAGNOSIS — R911 Solitary pulmonary nodule: Secondary | ICD-10-CM

## 2013-04-28 LAB — TSH: TSH: 2.87 u[IU]/mL (ref 0.35–5.50)

## 2013-04-28 LAB — CBC WITH DIFFERENTIAL/PLATELET
Eosinophils Relative: 0.9 % (ref 0.0–5.0)
HCT: 39.3 % (ref 39.0–52.0)
Lymphocytes Relative: 11.2 % — ABNORMAL LOW (ref 12.0–46.0)
Lymphs Abs: 0.6 10*3/uL — ABNORMAL LOW (ref 0.7–4.0)
Monocytes Relative: 11.6 % (ref 3.0–12.0)
Platelets: 186 10*3/uL (ref 150.0–400.0)
WBC: 5.5 10*3/uL (ref 4.5–10.5)

## 2013-04-28 LAB — COMPREHENSIVE METABOLIC PANEL
CO2: 29 mEq/L (ref 19–32)
Calcium: 9.1 mg/dL (ref 8.4–10.5)
Creatinine, Ser: 0.9 mg/dL (ref 0.4–1.5)
GFR: 96.57 mL/min (ref >60.00)
Glucose, Bld: 77 mg/dL (ref 70–99)
Sodium: 132 mEq/L — ABNORMAL LOW (ref 135–145)
Total Bilirubin: 1.3 mg/dL — ABNORMAL HIGH (ref 0.3–1.2)
Total Protein: 6.4 g/dL (ref 6.0–8.3)

## 2013-04-28 LAB — LIPID PANEL
Cholesterol: 149 mg/dL (ref 0–200)
HDL: 64.5 mg/dL (ref >39.00)
Triglycerides: 48 mg/dL (ref 0.0–149.0)
VLDL: 9.6 mg/dL (ref 0.0–40.0)

## 2013-04-28 NOTE — Assessment & Plan Note (Addendum)
Td < 10 years ago per pt Declined flu shot  zostavax-- discussed EKG-- brady, no sx, pt feels well cscope 08-2008, Dr Russella Dar was (-), next  in 10 year BMI stable Labs Needs paperwork for his job--> done

## 2013-04-28 NOTE — Progress Notes (Signed)
Pre visit review using our clinic review tool, if applicable. No additional management support is needed unless otherwise documented below in the visit note. 

## 2013-04-28 NOTE — Assessment & Plan Note (Signed)
F/u by oncology

## 2013-04-28 NOTE — Progress Notes (Signed)
   Subjective:    Patient ID: Kevin [name], male    DOB: 01-25-1956, 57 y.o.   MRN: [id number]  HPI CPX  Past Medical History  Diagnosis Date  . 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 Hx of radiation therapy 04/05/10 to 05/25/10    head/neck  . Hypothyroid 08/27/2011  . Pulmonary nodule 04/21/2012   Past Surgical History  Procedure Laterality Date  . Appendectomy      childhood  . Neck lesion biopsy  2011     Dx w/ tonsil cancer    History   Social History  . Marital Status: Married    Spouse Name: N/A    Number of Children: 4  . Years of Education: N/A   Occupational History  . Retail banker  [employer]   Social History Main Topics  . Smoking status: Never Smoker   . Smokeless tobacco: Never Used  . Alcohol Use: Yes     Comment: occassionally  . Drug Use: No  . Sexual Activity: Not on file   Other Topics Concern  . Not on file   Social History Narrative   Married, 4 children, 3 living         Family History  Problem Relation Age of Onset  . Stroke Father     dx in his 52s  . CAD Neg Hx   . Colon cancer Neg Hx   . Prostate cancer Neg Hx   . Diabetes Neg Hx     Review of Systems diet: healthy most of the time   exercise: active at work, exercises some days  No  CP, SOB, lower extremity edema Denies  nausea, vomiting diarrhea Denies  blood in the stools (-) cough, sputum production, (-) wheezing, chest congestion No dysuria, gross hematuria, difficulty urinating   No anxiety, depression        Objective:   Physical Exam BP 142/76  Pulse 62  Temp(Src) 98.5 F (36.9 C)  Ht 5\' 7"  (1.702 m)  Wt 135 lb (61.236 kg)  BMI 21.14 kg/m2  SpO2 99% General -- alert, well-developed, NAD.  Neck --no thyromegaly  HEENT-- Not pale.   Lungs -- normal respiratory effort, no intercostal retractions, no accessory muscle use, and normal breath sounds.  Heart-- normal rate, regular rhythm, no murmur.  Abdomen-- Not distended, good bowel sounds,soft,  non-tender. Rectal-- No external abnormalities noted. Normal sphincter tone. No rectal masses or tenderness. Brown stool   Prostate--Prostate gland firm and smooth, no enlargement, nodularity, tenderness, mass, asymmetry or induration. Extremities-- no pretibial edema bilaterally  Neurologic--  alert & oriented X3. Speech normal, gait normal. Psych-- Cognition and judgment appear intact. Cooperative with normal attention span and concentration. No anxious appearing , no depressed appearing.      Assessment & Plan:

## 2013-04-28 NOTE — Assessment & Plan Note (Signed)
Good compliance, labs  

## 2013-04-28 NOTE — Patient Instructions (Signed)
Get your blood work before you leave  Next visit for a physical exam  fasting, in 1 year Please make an appointment    

## 2013-04-29 ENCOUNTER — Telehealth: Payer: Self-pay | Admitting: Internal Medicine

## 2013-04-29 NOTE — Telephone Encounter (Signed)
Patient states that he dropped off his Kevin [name] forms when he came in for his physical. He was told that his cholesterol results needed to come back before the paperwork was ready. Patient is asking if his wife can come pick this up before we close today. Advised patient that I would forward request and we would call him when ready.

## 2013-04-29 NOTE — Telephone Encounter (Signed)
ready

## 2013-07-29 ENCOUNTER — Telehealth: Payer: Self-pay | Admitting: Internal Medicine

## 2013-07-29 DIAGNOSIS — E039 Hypothyroidism, unspecified: Secondary | ICD-10-CM

## 2013-07-29 MED ORDER — LEVOTHYROXINE SODIUM 50 MCG PO TABS
50.0000 ug | ORAL_TABLET | Freq: Every day | ORAL | Status: DC
Start: 2013-07-29 — End: 2014-01-30

## 2013-07-29 NOTE — Telephone Encounter (Signed)
rx sent

## 2013-07-29 NOTE — Telephone Encounter (Signed)
Patient called and requested a refill for levothyroxine (SYNTHROID) 50 MCG tablet Pharmacy [id number] - [place], [address]

## 2013-08-20 ENCOUNTER — Ambulatory Visit (HOSPITAL_COMMUNITY)
Admission: RE | Admit: 2013-08-20 | Discharge: 2013-08-20 | Disposition: A | Discharge status: Home / Self Care | Payer: Managed Care, Other (non HMO) | Source: Ambulatory Visit | Attending: Oncology | Admitting: Oncology

## 2013-08-20 ENCOUNTER — Other Ambulatory Visit (HOSPITAL_BASED_OUTPATIENT_CLINIC_OR_DEPARTMENT_OTHER): Payer: Managed Care, Other (non HMO)

## 2013-08-20 DIAGNOSIS — E039 Hypothyroidism, unspecified: Secondary | ICD-10-CM

## 2013-08-20 DIAGNOSIS — C109 Malignant neoplasm of oropharynx, unspecified: Secondary | ICD-10-CM | POA: Insufficient documentation

## 2013-08-20 LAB — CBC WITH DIFFERENTIAL/PLATELET
BASO%: 1 % (ref 0.0–2.0)
Basophils Absolute: 0 10*3/uL (ref 0.0–0.1)
EOS ABS: 0.1 10*3/uL (ref 0.0–0.5)
EOS%: 1.7 % (ref 0.0–7.0)
HCT: 43.5 % (ref 38.4–49.9)
HEMOGLOBIN: 14.5 g/dL (ref 13.0–17.1)
LYMPH%: 14.1 % (ref 14.0–49.0)
MCH: 29 pg (ref 27.2–33.4)
MCHC: 33.3 g/dL (ref 32.0–36.0)
MCV: 87.1 fL (ref 79.3–98.0)
MONO#: 0.6 10*3/uL (ref 0.1–0.9)
MONO%: 14.9 % — AB (ref 0.0–14.0)
NEUT#: 2.6 10*3/uL (ref 1.5–6.5)
NEUT%: 68.3 % (ref 39.0–75.0)
PLATELETS: 202 10*3/uL (ref 140–400)
RBC: 5 10*6/uL (ref 4.20–5.82)
RDW: 13.7 % (ref 11.0–14.6)
WBC: 3.8 10*3/uL — ABNORMAL LOW (ref 4.0–10.3)
lymph#: 0.5 10*3/uL — ABNORMAL LOW (ref 0.9–3.3)

## 2013-08-20 LAB — COMPREHENSIVE METABOLIC PANEL (CC13)
ALT: 10 U/L (ref 0–55)
AST: 15 U/L (ref 5–34)
Albumin: 4 g/dL (ref 3.5–5.0)
Alkaline Phosphatase: 75 U/L (ref 40–150)
Anion Gap: 9 mEq/L (ref 3–11)
BILIRUBIN TOTAL: 0.42 mg/dL (ref 0.20–1.20)
BUN: 15 mg/dL (ref 7.0–26.0)
CO2: 26 mEq/L (ref 22–29)
CREATININE: 1.2 mg/dL (ref 0.7–1.3)
Calcium: 9.4 mg/dL (ref 8.4–10.4)
Chloride: 101 mEq/L (ref 98–109)
GLUCOSE: 85 mg/dL (ref 70–140)
Potassium: 4.2 mEq/L (ref 3.5–5.1)
Sodium: 136 mEq/L (ref 136–145)
Total Protein: 6.9 g/dL (ref 6.4–8.3)

## 2013-08-20 LAB — TSH CHCC: TSH: 2.392 m[IU]/L (ref 0.320–4.118)

## 2013-08-21 ENCOUNTER — Ambulatory Visit (HOSPITAL_BASED_OUTPATIENT_CLINIC_OR_DEPARTMENT_OTHER): Payer: Managed Care, Other (non HMO) | Admitting: Hematology and Oncology

## 2013-08-21 ENCOUNTER — Encounter: Payer: Self-pay | Admitting: Hematology and Oncology

## 2013-08-21 VITALS — BP 128/80 | HR 67 | Temp 98.3°F | Resp 18 | Ht 67.0 in | Wt 137.3 lb

## 2013-08-21 DIAGNOSIS — E039 Hypothyroidism, unspecified: Secondary | ICD-10-CM

## 2013-08-21 DIAGNOSIS — C109 Malignant neoplasm of oropharynx, unspecified: Secondary | ICD-10-CM

## 2013-08-21 DIAGNOSIS — C099 Malignant neoplasm of tonsil, unspecified: Secondary | ICD-10-CM

## 2013-08-21 NOTE — Progress Notes (Signed)
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 male was transferred to my care after his prior physician has left.  I reviewed the patient's records extensive and collaborated the history with the patient. Summary of his history is as follows: The patient was originally diagnosed with cancer after presentation with bilateral neck lymphadenopathy. 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 He was treated with induction chemotherapy (cisplatin/5FU/Taxotere) between December 29, 2009 and 03/02/10. He was on concurrent weekly Carboplatin and daily XRT on 04/06/2010. He finished weekly Carboplatin on 05/11/2010 and XRT on 05/25/2010.   He is doing very well. He denies any dry mouth altered taste sensation. He is fully functional without any limitation of activities of daily living.  MEDICAL HISTORY:  Past Medical History  Diagnosis Date  . 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 Hx of radiation therapy 04/05/10 to 05/25/10    head/neck  . Hypothyroid 08/27/2011  . Pulmonary nodule 04/21/2012    SURGICAL HISTORY: Past Surgical History  Procedure Laterality Date  . Appendectomy      childhood  . Neck lesion biopsy  2011     Dx w/ tonsil cancer     SOCIAL HISTORY: History   Social History  . Marital Status: Married    Spouse Name: N/A    Number of Children: 4  . 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 Smoking status: Never Smoker   . Smokeless tobacco: Never Used  . Alcohol Use: Yes     Comment: occassionally  . Drug Use: No  .  Sexual Activity: Not on file   Other Topics Concern  . Not on file   Social History Narrative   Married, 4 children, 3 living          FAMILY HISTORY: Family History  Problem Relation Age of Onset  . Stroke Father     dx in his 48s  . CAD Neg Hx   . Colon cancer Neg Hx   . Prostate cancer Neg Hx   . Diabetes Neg Hx     ALLERGIES:  has No Known Allergies.  MEDICATIONS:  Current Outpatient Prescriptions  Medication Sig Dispense Refill  . levothyroxine (SYNTHROID) 50 MCG tablet Take 1 tablet (50 mcg total) by mouth daily.  90 tablet  1   No current facility-administered medications for this visit.    REVIEW OF SYSTEMS:   Constitutional: Denies fevers, chills or abnormal night sweats Eyes: Denies blurriness of vision, double vision or watery eyes Ears, nose, mouth, throat, and face: Denies mucositis or sore throat Respiratory: Denies cough, dyspnea or wheezes Cardiovascular: Denies palpitation, chest discomfort or lower extremity swelling Gastrointestinal:  Denies nausea, heartburn or change in bowel habits Skin: Denies abnormal skin rashes Lymphatics: Denies new lymphadenopathy or easy bruising Neurological:Denies numbness, tingling or new weaknesses Behavioral/Psych: Mood is stable, no new changes  All other systems were reviewed with the patient and are negative.  PHYSICAL EXAMINATION: ECOG PERFORMANCE STATUS: 0 - Asymptomatic  Filed Vitals:   08/21/13 1350  BP: 128/80  Pulse: 67  Temp: 98.3 F (36.8 C)  Resp: 18   Filed Weights   08/21/13 1350  Weight: 137 lb 4.8 oz (62.279 kg)    GENERAL:alert, no distress and comfortable SKIN: skin color, texture, turgor are normal, no rashes or significant lesions EYES: normal, conjunctiva are pink and non-injected, sclera clear OROPHARYNX:no exudate, normal lips, buccal mucosa, and tongue  NECK: Mildly thick due to prior radiation, thyroid normal size, non-tender, without nodularity LYMPH:  no palpable  lymphadenopathy in the cervical, axillary or inguinal LUNGS: clear to auscultation and percussion with normal breathing effort HEART: regular rate & rhythm and no murmurs without lower extremity edema ABDOMEN:abdomen soft, non-tender and normal bowel sounds Musculoskeletal:no cyanosis of digits and no clubbing  PSYCH: alert & oriented x 3 with fluent speech NEURO: no focal motor/sensory deficits  LABORATORY DATA:  I have reviewed the data as listed Lab Results  Component Value Date   WBC 3.8* 08/20/2013   HGB 14.5 08/20/2013   HCT 43.5 08/20/2013   MCV 87.1 08/20/2013   PLT 202 08/20/2013    Recent Labs  04/28/13 1335 08/20/13 1341  NA 132* 136  K 4.5 4.2  CL 97  --   CO2 29 26  GLUCOSE 77 85  BUN 10 15.0  CREATININE 0.9 1.2  CALCIUM 9.1 9.4  PROT 6.4 6.9  ALBUMIN 4.2 4.0  AST 38* 15  ALT 21 10  ALKPHOS 58 75  BILITOT 1.3* 0.42    RADIOGRAPHIC STUDIES: I have personally reviewed the radiological images as listed and agreed with the findings in the report. Dg Chest 2 View  08/20/2013   CLINICAL DATA:  Oropharyngeal cancer  EXAM: CHEST  2 VIEW  COMPARISON:  08/21/2012  FINDINGS: The heart size and mediastinal contours are within normal limits. Both lungs are clear. The visualized skeletal structures are unremarkable.  IMPRESSION: No acute chest finding.  Stable exam.   Electronically Signed   By: Daryll Brod M.D.   On: 08/20/2013 14:54    ASSESSMENT & PLAN:  #1 oropharyngeal cancer No evidence of recurrence. I will discharge him from medical oncology clinic but encouraged him to followup with ENT #2 hypothyroidism Thyroid function tests pending. I recommended future prescription refill and monitoring through his primary care provider  All questions were answered. The patient knows to call the clinic with any problems, questions or concerns. I spent 15 minutes counseling the patient face to face. The total time spent in the appointment was 20 minutes and more than 50%  was on counseling.     Heath Lark, MD 08/21/2013 4:57 PM

## 2013-08-22 ENCOUNTER — Ambulatory Visit (HOSPITAL_COMMUNITY): Payer: Managed Care, Other (non HMO)

## 2014-01-30 ENCOUNTER — Other Ambulatory Visit: Payer: Self-pay | Admitting: Internal Medicine

## 2014-02-04 ENCOUNTER — Other Ambulatory Visit: Payer: Self-pay

## 2014-02-04 MED ORDER — LEVOTHYROXINE SODIUM 50 MCG PO TABS: ORAL_TABLET | ORAL | Status: DC

## 2014-02-04 NOTE — Telephone Encounter (Signed)
Patient would like to know if he needs to schedule appointment for labs or to see Dr. Larose Kells before his yearly

## 2014-02-04 NOTE — Telephone Encounter (Signed)
Patient states that his insurance will only cover for a 90 day supply

## 2014-02-04 NOTE — Telephone Encounter (Signed)
Patient called in regarding this refill. He states that he was told at last visit(12/14) that he was not supposed to come back for another year. Patient wants to know why Dr. Larose Kells will only refill 30 day supply when he was told that he wasn't supposed to come back for another year. Patient scheduled yearly.

## 2014-02-04 NOTE — Telephone Encounter (Signed)
Since Pt has appt scheduled for December, I have refilled # 90 with 0 RF until he is seen. Please let Pt know labs will be drawn when he is seen by Dr. Larose Kells.

## 2014-02-05 NOTE — Telephone Encounter (Signed)
Informed patient of this.  °

## 2014-04-29 ENCOUNTER — Encounter: Payer: Self-pay | Admitting: Internal Medicine

## 2014-04-29 ENCOUNTER — Telehealth: Payer: Self-pay | Admitting: Internal Medicine

## 2014-04-29 ENCOUNTER — Ambulatory Visit (INDEPENDENT_AMBULATORY_CARE_PROVIDER_SITE_OTHER): Payer: Managed Care, Other (non HMO) | Admitting: Internal Medicine

## 2014-04-29 VITALS — BP 122/78 | HR 73 | Temp 97.5°F | Ht 67.0 in | Wt 136.4 lb

## 2014-04-29 DIAGNOSIS — Z0000 Encounter for general adult medical examination without abnormal findings: Secondary | ICD-10-CM

## 2014-04-29 DIAGNOSIS — C090 Malignant neoplasm of tonsillar fossa: Secondary | ICD-10-CM

## 2014-04-29 DIAGNOSIS — R911 Solitary pulmonary nodule: Secondary | ICD-10-CM

## 2014-04-29 LAB — LIPID PANEL
CHOL/HDL RATIO: 3
Cholesterol: 181 mg/dL (ref 0–200)
HDL: 66.9 mg/dL (ref >39.00)
LDL Cholesterol: 104 mg/dL — ABNORMAL HIGH (ref 0–99)
NonHDL: 114.1
TRIGLYCERIDES: 51 mg/dL (ref 0.0–149.0)
VLDL: 10.2 mg/dL (ref 0.0–40.0)

## 2014-04-29 LAB — COMPREHENSIVE METABOLIC PANEL
ALBUMIN: 4.1 g/dL (ref 3.5–5.2)
ALT: 13 U/L (ref 0–53)
AST: 13 U/L (ref 0–37)
Alkaline Phosphatase: 62 U/L (ref 39–117)
BUN: 12 mg/dL (ref 6–23)
CALCIUM: 9.3 mg/dL (ref 8.4–10.5)
CHLORIDE: 99 meq/L (ref 96–112)
CO2: 26 meq/L (ref 19–32)
Creatinine, Ser: 1.1 mg/dL (ref 0.4–1.5)
GFR: 71.89 mL/min (ref >60.00)
Glucose, Bld: 88 mg/dL (ref 70–99)
POTASSIUM: 4 meq/L (ref 3.5–5.1)
SODIUM: 131 meq/L — AB (ref 135–145)
TOTAL PROTEIN: 6.6 g/dL (ref 6.0–8.3)
Total Bilirubin: 0.8 mg/dL (ref 0.2–1.2)

## 2014-04-29 LAB — TSH: TSH: 3.68 u[IU]/mL (ref 0.35–4.50)

## 2014-04-29 LAB — PSA: PSA: 2.63 ng/mL (ref 0.10–4.00)

## 2014-04-29 MED ORDER — LEVOTHYROXINE SODIUM 50 MCG PO TABS: ORAL_TABLET | ORAL | Status: DC

## 2014-04-29 NOTE — Assessment & Plan Note (Addendum)
CT neck showed a 4.3 mm RUL nodule 2013 Per oncology note, the plan is to follow-up nodule with yearly chest x-ray (per pt---was told to prevent additional radiation) , last chest x-ray was 08-2013. Will schedule a chest x-ray next April.

## 2014-04-29 NOTE — Patient Instructions (Signed)
Get your blood work before you leave   See your ENT doctor regularly.  Please come back to the office in 1 year  for a physical exam. Come back fasting

## 2014-04-29 NOTE — Progress Notes (Signed)
   Subjective:    Patient ID: Kevin [name], male    DOB: 1955/07/10, 58 y.o.   MRN: [id number]  DOS:  04/29/2014 Type of visit - description : cpx Interval history: no concerns   ROS Denies chest pain, difficulty breathing or lower extremity edema No nausea, vomiting, diarrhea or blood in the stools. No cough, sputum production. No anxiety depression. No dysuria, gross hematuria or difficulty urinating  Past Medical History  Diagnosis Date  . 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 Hx of radiation therapy 04/05/10 to 05/25/10    head/neck  . Hypothyroid 08/27/2011  . Pulmonary nodule 04/21/2012    Past Surgical History  Procedure Laterality Date  . Appendectomy      childhood  . Neck lesion biopsy  2011     Dx w/ tonsil cancer     History   Social History  . Marital Status: Married    Spouse Name: N/A    Number of Children: 4  . 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 Smoking status: Never Smoker   . Smokeless tobacco: Never Used  . Alcohol Use: Yes     Comment: occassionally  . Drug Use: No  . Sexual Activity: Not on file   Other Topics Concern  . Not on file   Social History Narrative   Married, 4 children, 3 living           Family History  Problem Relation Age of Onset  . Stroke Father     dx in his 3s  . CAD Neg Hx   . Colon cancer Neg Hx   . Prostate cancer Neg Hx   . Diabetes Neg Hx        Medication List       This list is accurate as of: 04/29/14 11:59 PM.  Always use your most recent med list.               levothyroxine 50 MCG tablet  Commonly known as:  SYNTHROID, LEVOTHROID  Take 1 tablet daily.           Objective:   Physical Exam BP 122/78 mmHg  Pulse 73  Temp(Src) 97.5 F (36.4 C) (Oral)  Ht 5\' 7"  (1.702 m)  Wt 136 lb 6.4 oz (61.871 kg)  BMI 21.36 kg/m2  SpO2 94% General -- alert, well-developed, NAD.  HEENT-- hoarseness at  baseline Lungs -- normal respiratory effort, no intercostal retractions, no accessory muscle use, and normal breath sounds.  Heart-- normal rate, regular rhythm, no murmur.  Abdomen-- Not distended, good bowel sounds,soft, non-tender. Rectal-- No external abnormalities noted. Normal sphincter tone. No rectal masses or tenderness. Stool brown  Prostate--Prostate gland firm and smooth, no enlargement, nodularity, tenderness, mass, asymmetry or induration. Extremities-- no pretibial edema bilaterally  Neurologic--  alert & oriented X3. Speech normal, gait appropriate for age, strength symmetric and appropriate for age.  Psych-- Cognition and judgment appear intact. Cooperative with normal attention span and concentration. No anxious or depressed appearing.        Assessment & Plan:

## 2014-04-29 NOTE — Assessment & Plan Note (Signed)
Td 2007 Declined flu shot  Discussed pnm shot-- not interested  zostavax-- discussed before  cscope 08-2008, Dr Fuller Plan was (-), next  in 10 year Labs Needs paperwork for his job--> to be completed with lab results   Diet and exercise discussed

## 2014-04-29 NOTE — Telephone Encounter (Signed)
Already refilled today during visit.

## 2014-04-29 NOTE — Telephone Encounter (Signed)
Caller name: Kevin [name], Kevin [name] Relation to pt: self  Call back number: [phone] Pharmacy: [id number] - [place], [address] AT [address] [phone] (Phone) [phone] (Fax)     Reason for call:  Pt requesting a refill levothyroxine (SYNTHROID, LEVOTHROID) 50 MCG tablet. Pt states no rush patient has 10 pills.

## 2014-04-29 NOTE — Progress Notes (Signed)
Pre visit review using our clinic review tool, if applicable. No additional management support is needed unless otherwise documented below in the visit note. 

## 2014-04-29 NOTE — Assessment & Plan Note (Signed)
Saw oncology this year, doing well,  discharge from the oncology office but recommended follow-up with ENT which he plans to

## 2014-05-04 ENCOUNTER — Telehealth: Payer: Self-pay

## 2014-05-04 ENCOUNTER — Telehealth: Payer: Self-pay | Admitting: Internal Medicine

## 2014-05-04 NOTE — Telephone Encounter (Signed)
LMOM regarding lab results.

## 2014-05-04 NOTE — Telephone Encounter (Signed)
LMOM informing Pt that insurance form is completed. Informed him I can fax to number on form for him if he would like and mail copy back. Instructed him to call and let me know of his decision.

## 2014-05-04 NOTE — Telephone Encounter (Signed)
Pt returning your call back. Best # [phone]

## 2014-05-05 NOTE — Telephone Encounter (Signed)
Faxed to [phone] number on form, copies made and sent for scanning. Original mailed back to Pt.

## 2014-05-05 NOTE — Telephone Encounter (Signed)
See previous phone message. 

## 2014-05-05 NOTE — Telephone Encounter (Signed)
LMOM informing Pt of lab results (see lab result notes), and to inform him his paperwork is ready that he dropped off at last visit. I offered to either fax it to the number on forms for him and mail copy back to him or place it at front desk for pick up.

## 2014-09-19 ENCOUNTER — Telehealth: Payer: Self-pay | Admitting: Internal Medicine

## 2014-09-19 DIAGNOSIS — R911 Solitary pulmonary nodule: Secondary | ICD-10-CM

## 2014-09-19 NOTE — Telephone Encounter (Signed)
Advise patient, needs a chest x-ray, DX pulmonary nodule. Please arrange

## 2014-09-20 NOTE — Telephone Encounter (Signed)
LMOM for Pt to return call. Chest x-ray ordered.

## 2014-09-22 ENCOUNTER — Ambulatory Visit (HOSPITAL_BASED_OUTPATIENT_CLINIC_OR_DEPARTMENT_OTHER)
Admission: RE | Admit: 2014-09-22 | Discharge: 2014-09-22 | Disposition: A | Discharge status: Home / Self Care | Payer: BLUE CROSS/BLUE SHIELD | Source: Ambulatory Visit | Attending: Internal Medicine | Admitting: Internal Medicine

## 2014-09-22 DIAGNOSIS — R911 Solitary pulmonary nodule: Secondary | ICD-10-CM | POA: Insufficient documentation

## 2014-09-22 DIAGNOSIS — Z859 Personal history of malignant neoplasm, unspecified: Secondary | ICD-10-CM | POA: Insufficient documentation

## 2014-09-22 NOTE — Telephone Encounter (Signed)
thx

## 2014-09-22 NOTE — Telephone Encounter (Signed)
Pt had CXR scheduled for 09/22/2014 at 0810.

## 2014-11-09 ENCOUNTER — Telehealth: Payer: Self-pay | Admitting: Internal Medicine

## 2014-11-09 DIAGNOSIS — R972 Elevated prostate specific antigen [PSA]: Secondary | ICD-10-CM

## 2014-11-09 NOTE — Telephone Encounter (Signed)
Letter printed and mailed to Pt. PSA future ordered.

## 2014-11-09 NOTE — Telephone Encounter (Signed)
Due for a PSA, please arrange. DX elevated PSA velocity

## 2014-11-23 ENCOUNTER — Other Ambulatory Visit (INDEPENDENT_AMBULATORY_CARE_PROVIDER_SITE_OTHER): Payer: BLUE CROSS/BLUE SHIELD

## 2014-11-23 DIAGNOSIS — R972 Elevated prostate specific antigen [PSA]: Secondary | ICD-10-CM

## 2014-11-23 LAB — PSA: PSA: 2.09 ng/mL (ref 0.10–4.00)

## 2015-04-10 ENCOUNTER — Other Ambulatory Visit: Payer: Self-pay | Admitting: Internal Medicine

## 2018-09-15 ENCOUNTER — Encounter: Payer: Self-pay | Admitting: Gastroenterology

## 2018-10-23 ENCOUNTER — Ambulatory Visit: Payer: BC Managed Care – PPO | Admitting: *Deleted

## 2018-10-23 ENCOUNTER — Other Ambulatory Visit: Payer: Self-pay

## 2018-10-23 VITALS — Ht 66.0 in | Wt 135.0 lb

## 2018-10-23 DIAGNOSIS — Z1211 Encounter for screening for malignant neoplasm of colon: Secondary | ICD-10-CM

## 2018-10-23 MED ORDER — SUPREP BOWEL PREP KIT 17.5-3.13-1.6 GM/177ML PO SOLN: 1.0000 | Freq: Once | ORAL | 0 refills | Status: AC

## 2018-10-23 NOTE — Progress Notes (Signed)
No egg or soy allergy known to patient  No issues with past sedation with any surgeries  or procedures, no intubation problems  No diet pills per patient No home 02 use per patient  No blood thinners per patient  Pt denies issues with constipation  No A fib or A flutter  EMMI video sent to pt's e mail   Pt verified name, DOB, address and insurance during PV today. Pt mailed instruction packet to included paper to complete and mail back to [facility] with addressed and stamped envelope, Emmi video, copy of consent form to read and not return, and instructions. Suprep $15  coupon mailed in packet. PV completed over the phone. 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  We want them to wear a mask (we do not have any that we can provide them), practice social distancing, and we will check their temperatures when they get here.  I did remind patient that their care partner needs to stay in the parking lot the entire time. Pt will wear mask into building

## 2018-11-10 ENCOUNTER — Encounter: Payer: Self-pay | Admitting: Gastroenterology

## 2018-11-14 ENCOUNTER — Telehealth: Payer: Self-pay | Admitting: Gastroenterology

## 2018-11-14 NOTE — Telephone Encounter (Signed)
Spoke with patient regarding Covid-19 screening question Covid-19 Screening Questions:  Do you now or have you had a fever in the last 14 days? no  Do you have any respiratory symptoms of shortness of breath or cough now or in the last 14 days? no  Do you have any family members or close contacts with diagnosed or suspected Covid-19 in the past 14 days? no  Have you been tested for Covid-19 and found to be positive? no  Pt made aware of that care partner may wait in the car or come up to the lobby during the procedure but will need to provide their own mask.

## 2018-11-17 ENCOUNTER — Other Ambulatory Visit: Payer: Self-pay

## 2018-11-17 ENCOUNTER — Ambulatory Visit (AMBULATORY_SURGERY_CENTER): Payer: BC Managed Care – PPO | Admitting: Gastroenterology

## 2018-11-17 ENCOUNTER — Encounter: Payer: Self-pay | Admitting: Gastroenterology

## 2018-11-17 VITALS — BP 102/61 | HR 90 | Temp 99.7°F | Resp 18 | Ht 66.0 in | Wt 135.0 lb

## 2018-11-17 DIAGNOSIS — Z1212 Encounter for screening for malignant neoplasm of rectum: Secondary | ICD-10-CM | POA: Diagnosis not present

## 2018-11-17 DIAGNOSIS — Z1211 Encounter for screening for malignant neoplasm of colon: Secondary | ICD-10-CM

## 2018-11-17 MED ORDER — SODIUM CHLORIDE 0.9 % IV SOLN: 500.0000 mL | Freq: Once | INTRAVENOUS | Status: AC

## 2018-11-17 NOTE — Patient Instructions (Signed)
YOU HAD AN ENDOSCOPIC PROCEDURE TODAY AT THE [facility]:   Refer to the procedure report that was given to you for any specific questions about what was found during the examination.  If the procedure report does not answer your questions, please call your gastroenterologist to clarify.  If you requested that your care partner not be given the details of your procedure findings, then the procedure report has been included in a sealed envelope for you to review at your convenience later.  YOU SHOULD EXPECT: Some feelings of bloating in the abdomen. Passage of more gas than usual.  Walking can help get rid of the air that was put into your GI tract during the procedure and reduce the bloating. If you had a lower endoscopy (such as a colonoscopy or flexible sigmoidoscopy) you may notice spotting of blood in your stool or on the toilet paper. If you underwent a bowel prep for your procedure, you may not have a normal bowel movement for a few days.  Please Note:  You might notice some irritation and congestion in your nose or some drainage.  This is from the oxygen used during your procedure.  There is no need for concern and it should clear up in a day or so.  SYMPTOMS TO REPORT IMMEDIATELY:   Following lower endoscopy (colonoscopy or flexible sigmoidoscopy):  Excessive amounts of blood in the stool  Significant tenderness or worsening of abdominal pains  Swelling of the abdomen that is new, acute  Fever of 100F or higher   For urgent or emergent issues, a gastroenterologist can be reached at any hour by calling [phone].   DIET:  We do recommend a small meal at first, but then you may proceed to your regular diet.  Drink plenty of fluids but you should avoid alcoholic beverages for 24 hours.  MEDICATIONS: Continue present medications.  Please see handouts given to you by your recovery nurse.  ACTIVITY:  You should plan to take it easy for the rest of today and you should  NOT DRIVE or use heavy machinery until tomorrow (because of the sedation medicines used during the test).    FOLLOW UP: Our staff will call the number listed on your records 48-72 hours following your procedure to check on you and address any questions or concerns that you may have regarding the information given to you following your procedure. If we do not reach you, we will leave a message.  We will attempt to reach you two times.  During this call, we will ask if you have developed any symptoms of COVID 19. If you develop any symptoms (ie: fever, flu-like symptoms, shortness of breath, cough etc.) before then, please call [phone].  If you test positive for Covid 19 in the 2 weeks post procedure, please call and report this information to us.    If any biopsies were taken you will be contacted by phone or by letter within the next 1-3 weeks.  Please call us at [phone] if you have not heard about the biopsies in 3 weeks.   Thank you for allowing us to provide for your healthcare needs today.   SIGNATURES/CONFIDENTIALITY: You and/or your care partner have signed paperwork which will be entered into your electronic medical record.  These signatures attest to the fact that that the information above on your After Visit Summary has been reviewed and is understood.  Full responsibility of the confidentiality of this discharge information lies with you and/or   your care-partner. 

## 2018-11-17 NOTE — Progress Notes (Signed)
Pt's states no medical or surgical changes since previsit or office visit.  Temp-Kevin [name]  Vital signs-judy branson

## 2018-11-17 NOTE — Progress Notes (Signed)
To PACU, VSS. Report to RN.tb 

## 2018-11-17 NOTE — Op Note (Signed)
[facility] Patient Name: Tad Fancher Procedure Date: 11/17/2018 2:42 PM MRN: [id number] Endoscopist: Ladene Artist , MD Age: 63 Referring MD:  Date of Birth: August 22, 1955 Gender: Male Account #: [id number] Procedure:                Colonoscopy Indications:              Screening for colorectal malignant neoplasm Medicines:                Monitored Anesthesia Care Procedure:                Pre-Anesthesia Assessment:                           - Prior to the procedure, a History and Physical                            was performed, and patient medications and                            allergies were reviewed. The patient's tolerance of                            previous anesthesia was also reviewed. The risks                            and benefits of the procedure and the sedation                            options and risks were discussed with the patient.                            All questions were answered, and informed consent                            was obtained. Prior Anticoagulants: The patient has                            taken no previous anticoagulant or antiplatelet                            agents. ASA Grade Assessment: II - A patient with                            mild systemic disease. After reviewing the risks                            and benefits, the patient was deemed in                            satisfactory condition to undergo the procedure.                           After obtaining informed consent, the colonoscope  was passed under direct vision. Throughout the                            procedure, the patient's blood pressure, pulse, and                            oxygen saturations were monitored continuously. The                            Model PCF-[id number]) scope was introduced                            through the anus and advanced to the the cecum,                            identified by  appendiceal orifice and ileocecal                            valve. The ileocecal valve, appendiceal orifice,                            and rectum were photographed. The quality of the                            bowel preparation was good. The colonoscopy was                            performed without difficulty. The patient tolerated                            the procedure well. Scope In: 3:06:13 PM Scope Out: 3:22:18 PM Scope Withdrawal Time: 0 hours 12 minutes 55 seconds  Total Procedure Duration: 0 hours 16 minutes 5 seconds  Findings:                 The perianal and digital rectal examinations were                            normal.                           Internal hemorrhoids were found during                            retroflexion. The hemorrhoids were small and Grade                            I (internal hemorrhoids that do not prolapse).                           The exam was otherwise without abnormality on                            direct and retroflexion views. Complications:            No immediate complications.  Estimated blood loss:                            None. Estimated Blood Loss:     Estimated blood loss: none. Impression:               - Internal hemorrhoids.                           - The examination was otherwise normal on direct                            and retroflexion views.                           - No specimens collected. Recommendation:           - Repeat colonoscopy in 10 years for screening                            purposes.                           - Patient has a contact number available for                            emergencies. The signs and symptoms of potential                            delayed complications were discussed with the                            patient. Return to normal activities tomorrow.                            Written discharge instructions were provided to the                            patient.                            - Resume previous diet.                           - Continue present medications. Ladene Artist, MD 11/17/2018 3:39:38 PM This report has been signed electronically.

## 2018-11-19 ENCOUNTER — Telehealth: Payer: Self-pay

## 2018-11-19 NOTE — Telephone Encounter (Signed)
  Follow up Call-  Call back number 11/17/2018  Post procedure Call Back phone  # [phone]  Permission to leave phone message Yes  Some recent data might be hidden     Patient questions:  Do you have a fever, pain , or abdominal swelling? No. Pain Score  0 *  Have you tolerated food without any problems? Yes.    Have you been able to return to your normal activities? Yes.    Do you have any questions about your discharge instructions: Diet   No. Medications  No. Follow up visit  No.  Do you have questions or concerns about your Care? No.  Actions: * If pain score is 4 or above: No action needed, pain <4.  1. Have you developed a fever since your procedure? No  2.   Have you had an respiratory symptoms (SOB or cough) since your procedure? No  3.   Have you tested positive for COVID 19 since your procedure No  4.   Have you had any family members/close contacts diagnosed with the COVID 19 since your procedure?  No   If yes to any of these questions please route to Joylene John, RN and Alphonsa Gin, RN.

## 2018-12-02 ENCOUNTER — Emergency Department (HOSPITAL_COMMUNITY): Payer: BC Managed Care – PPO

## 2018-12-02 ENCOUNTER — Other Ambulatory Visit: Payer: Self-pay

## 2018-12-02 ENCOUNTER — Inpatient Hospital Stay (HOSPITAL_COMMUNITY)
Admission: EM | Admit: 2018-12-02 | Discharge: 2018-12-13 | DRG: 177 | Disposition: A | Discharge status: Home / Self Care | Payer: BC Managed Care – PPO | Attending: Family Medicine | Admitting: Family Medicine

## 2018-12-02 DIAGNOSIS — E039 Hypothyroidism, unspecified: Secondary | ICD-10-CM | POA: Diagnosis present

## 2018-12-02 DIAGNOSIS — Z9049 Acquired absence of other specified parts of digestive tract: Secondary | ICD-10-CM

## 2018-12-02 DIAGNOSIS — J869 Pyothorax without fistula: Secondary | ICD-10-CM | POA: Diagnosis not present

## 2018-12-02 DIAGNOSIS — Z6821 Body mass index (BMI) 21.0-21.9, adult: Secondary | ICD-10-CM

## 2018-12-02 DIAGNOSIS — R911 Solitary pulmonary nodule: Secondary | ICD-10-CM | POA: Diagnosis present

## 2018-12-02 DIAGNOSIS — R05 Cough: Secondary | ICD-10-CM | POA: Diagnosis not present

## 2018-12-02 DIAGNOSIS — E861 Hypovolemia: Secondary | ICD-10-CM | POA: Diagnosis present

## 2018-12-02 DIAGNOSIS — Z79899 Other long term (current) drug therapy: Secondary | ICD-10-CM

## 2018-12-02 DIAGNOSIS — E876 Hypokalemia: Secondary | ICD-10-CM | POA: Diagnosis present

## 2018-12-02 DIAGNOSIS — D473 Essential (hemorrhagic) thrombocythemia: Secondary | ICD-10-CM | POA: Diagnosis present

## 2018-12-02 DIAGNOSIS — J948 Other specified pleural conditions: Secondary | ICD-10-CM | POA: Diagnosis present

## 2018-12-02 DIAGNOSIS — E43 Unspecified severe protein-calorie malnutrition: Secondary | ICD-10-CM | POA: Diagnosis present

## 2018-12-02 DIAGNOSIS — Z85818 Personal history of malignant neoplasm of other sites of lip, oral cavity, and pharynx: Secondary | ICD-10-CM

## 2018-12-02 DIAGNOSIS — J90 Pleural effusion, not elsewhere classified: Secondary | ICD-10-CM | POA: Diagnosis present

## 2018-12-02 DIAGNOSIS — Z20828 Contact with and (suspected) exposure to other viral communicable diseases: Secondary | ICD-10-CM | POA: Diagnosis present

## 2018-12-02 DIAGNOSIS — Z923 Personal history of irradiation: Secondary | ICD-10-CM

## 2018-12-02 DIAGNOSIS — L0291 Cutaneous abscess, unspecified: Secondary | ICD-10-CM

## 2018-12-02 DIAGNOSIS — Z79890 Hormone replacement therapy (postmenopausal): Secondary | ICD-10-CM

## 2018-12-02 DIAGNOSIS — N151 Renal and perinephric abscess: Secondary | ICD-10-CM | POA: Diagnosis present

## 2018-12-02 DIAGNOSIS — Z4682 Encounter for fitting and adjustment of non-vascular catheter: Secondary | ICD-10-CM

## 2018-12-02 DIAGNOSIS — D649 Anemia, unspecified: Secondary | ICD-10-CM | POA: Diagnosis present

## 2018-12-02 DIAGNOSIS — I10 Essential (primary) hypertension: Secondary | ICD-10-CM | POA: Diagnosis present

## 2018-12-02 DIAGNOSIS — E871 Hypo-osmolality and hyponatremia: Secondary | ICD-10-CM | POA: Diagnosis present

## 2018-12-02 LAB — CBC WITH DIFFERENTIAL/PLATELET
Abs Immature Granulocytes: 0.14 10*3/uL — ABNORMAL HIGH (ref 0.00–0.07)
Basophils Absolute: 0.1 10*3/uL (ref 0.0–0.1)
Basophils Relative: 0 %
Eosinophils Absolute: 0.1 10*3/uL (ref 0.0–0.5)
Eosinophils Relative: 0 %
HCT: 28.3 % — ABNORMAL LOW (ref 39.0–52.0)
Hemoglobin: 9.4 g/dL — ABNORMAL LOW (ref 13.0–17.0)
Immature Granulocytes: 1 %
Lymphocytes Relative: 5 %
Lymphs Abs: 0.9 10*3/uL (ref 0.7–4.0)
MCH: 27.1 pg (ref 26.0–34.0)
MCHC: 33.2 g/dL (ref 30.0–36.0)
MCV: 81.6 fL (ref 80.0–100.0)
Monocytes Absolute: 1.4 10*3/uL — ABNORMAL HIGH (ref 0.1–1.0)
Monocytes Relative: 8 %
Neutro Abs: 16.3 10*3/uL — ABNORMAL HIGH (ref 1.7–7.7)
Neutrophils Relative %: 86 %
Platelets: 601 10*3/uL — ABNORMAL HIGH (ref 150–400)
RBC: 3.47 MIL/uL — ABNORMAL LOW (ref 4.22–5.81)
RDW: 14.4 % (ref 11.5–15.5)
WBC: 18.9 10*3/uL — ABNORMAL HIGH (ref 4.0–10.5)
nRBC: 0 % (ref 0.0–0.2)

## 2018-12-02 LAB — COMPREHENSIVE METABOLIC PANEL
ALT: 17 U/L (ref 0–44)
AST: 21 U/L (ref 15–41)
Albumin: 2 g/dL — ABNORMAL LOW (ref 3.5–5.0)
Alkaline Phosphatase: 136 U/L — ABNORMAL HIGH (ref 38–126)
Anion gap: 11 (ref 5–15)
BUN: 10 mg/dL (ref 8–23)
CO2: 21 mmol/L — ABNORMAL LOW (ref 22–32)
Calcium: 8.1 mg/dL — ABNORMAL LOW (ref 8.9–10.3)
Chloride: 97 mmol/L — ABNORMAL LOW (ref 98–111)
Creatinine, Ser: 0.91 mg/dL (ref 0.61–1.24)
GFR calc Af Amer: >60 mL/min (ref >60)
GFR calc non Af Amer: >60 mL/min (ref >60)
Glucose, Bld: 136 mg/dL — ABNORMAL HIGH (ref 70–99)
Potassium: 3.1 mmol/L — ABNORMAL LOW (ref 3.5–5.1)
Sodium: 129 mmol/L — ABNORMAL LOW (ref 135–145)
Total Bilirubin: 0.3 mg/dL (ref 0.3–1.2)
Total Protein: 6.3 g/dL — ABNORMAL LOW (ref 6.5–8.1)

## 2018-12-02 LAB — LACTIC ACID, PLASMA: Lactic Acid, Venous: 1 mmol/L (ref 0.5–1.9)

## 2018-12-02 MED ORDER — VANCOMYCIN HCL IN DEXTROSE 1-5 GM/200ML-% IV SOLN
1000.0000 mg | Freq: Once | INTRAVENOUS | Status: AC
  Administered 2018-12-03: 1000 mg via INTRAVENOUS
  Filled 2018-12-02: qty 200

## 2018-12-02 MED ORDER — SODIUM CHLORIDE 0.9% FLUSH: 3.0000 mL | Freq: Once | INTRAVENOUS | Status: DC

## 2018-12-02 MED ORDER — SODIUM CHLORIDE 0.9 % IV SOLN
1.0000 g | Freq: Once | INTRAVENOUS | Status: AC
  Administered 2018-12-03: 1 g via INTRAVENOUS
  Filled 2018-12-02: qty 1

## 2018-12-02 MED ORDER — SODIUM CHLORIDE 0.9 % IV BOLUS
1000.0000 mL | Freq: Once | INTRAVENOUS | Status: AC
  Administered 2018-12-03: 1000 mL via INTRAVENOUS

## 2018-12-02 NOTE — ED Provider Notes (Signed)
[facility] EMERGENCY DEPARTMENT Provider Note   CSN: [id number] Arrival date & time: 12/02/18  1617    History   Chief Complaint Chief Complaint  Patient presents with  . Blood Infection    HPI Kevin [name] is a 63 y.o. male.     HPI  This is 63 year old male with a history of head neck cancer, hypertension, pulmonary nodule who presents with cough, chest pain, and abnormal chest x-ray from his primary physician.  Patient reports that over the last 2 to 3 days he has had some right-sided chest discomfort that is worse with coughing and breathing.  Last night he slept on his right side and began to cough up some green sputum.  He has not had fevers.  No known sick contacts or COVID exposures.  He saw his primary physician today who told him that he had an elevated white count and an abnormal chest x-ray.  Patient reports remote history of tooth infection for which he finished antibiotics several months ago.  Otherwise he has not had any recent antibiotic use.  He denies abdominal pain, nausea, vomiting, diarrhea.  Past Medical History:  Diagnosis Date  . Hx of radiation therapy 04/05/10 to 05/25/10   head/neck  . Hypertension   . Hypothyroid 08/27/2011  . 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 Pulmonary nodule 04/21/2012    Patient Active Problem List   Diagnosis Date Noted  . Empyema ([place]) 12/03/2018  . Annual physical exam 04/21/2012  . Pulmonary nodule 04/21/2012  . Malignant neoplasm of tonsillar fossa ([place]) 10/13/2011  . Hypothyroid 08/27/2011  . Hx of radiation therapy     Past Surgical History:  Procedure Laterality Date  . APACO     with tonsillar cancer  . APPENDECTOMY     childhood  . COLONOSCOPY    . NECK LESION BIOPSY  2011    Dx w/ tonsil cancer   . WISDOM TOOTH EXTRACTION          Home Medications    Prior to Admission medications   Medication Sig Start Date End Date Taking? Authorizing Provider  amLODipine  (NORVASC) 10 MG tablet Take 10 mg by mouth daily.  05/22/16  Yes [provider]  levothyroxine (SYNTHROID) 75 MCG tablet Take 75 mcg by mouth daily before breakfast.   Yes [provider]    Family History Family History  Problem Relation Age of Onset  . Stroke Father        dx in his 61s  . CAD Neg Hx   . Colon cancer Neg Hx   . Prostate cancer Neg Hx   . Diabetes Neg Hx   . Colon polyps Neg Hx   . Rectal cancer Neg Hx   . Stomach cancer Neg Hx   . Esophageal cancer Neg Hx     Social History Social History   Tobacco Use  . Smoking status: Never Smoker  . Smokeless tobacco: Never Used  Substance Use Topics  . Alcohol use: Yes    Comment: occassionally  . Drug use: No     Allergies   Patient has no known allergies.   Review of Systems Review of Systems  Constitutional: Negative for fever.  Respiratory: Positive for cough. Negative for shortness of breath.   Cardiovascular: Positive for chest pain. Negative for leg swelling.  Gastrointestinal: Negative for nausea and vomiting.  Genitourinary: Negative for dysuria.  Neurological: Negative for weakness.  All other systems reviewed and are  negative.    Physical Exam Updated Vital Signs BP 135/79   Pulse 87   Temp 98.2 F (36.8 C) (Oral)   Resp 15   Wt 59 kg   SpO2 95%   BMI 20.98 kg/m   Physical Exam Vitals signs and nursing note reviewed.  Constitutional:      Appearance: He is well-developed. He is not ill-appearing.  HENT:     Head: Normocephalic and atraumatic.     Mouth/Throat:     Mouth: Mucous membranes are moist.  Eyes:     Pupils: Pupils are equal, round, and reactive to light.  Neck:     Musculoskeletal: Neck supple.  Cardiovascular:     Rate and Rhythm: Normal rate and regular rhythm.     Heart sounds: Normal heart sounds. No murmur.  Pulmonary:     Effort: Pulmonary effort is normal. No respiratory distress.     Breath sounds: No wheezing.     Comments: Diminished  breath sounds right lower lobe Abdominal:     General: Bowel sounds are normal.     Palpations: Abdomen is soft.     Tenderness: There is no abdominal tenderness. There is no rebound.  Musculoskeletal:        General: No tenderness.     Right lower leg: No edema.     Left lower leg: No edema.  Lymphadenopathy:     Cervical: No cervical adenopathy.  Skin:    General: Skin is warm and dry.  Neurological:     Mental Status: He is alert and oriented to person, place, and time.  Psychiatric:        Mood and Affect: Mood normal.      ED Treatments / Results  Labs (all labs ordered are listed, but only abnormal results are displayed) Labs Reviewed  COMPREHENSIVE METABOLIC PANEL - Abnormal; Notable for the following components:      Result Value   Sodium 129 (*)    Potassium 3.1 (*)    Chloride 97 (*)    CO2 21 (*)    Glucose, Bld 136 (*)    Calcium 8.1 (*)    Total Protein 6.3 (*)    Albumin 2.0 (*)    Alkaline Phosphatase 136 (*)    All other components within normal limits  CBC WITH DIFFERENTIAL/PLATELET - Abnormal; Notable for the following components:   WBC 18.9 (*)    RBC 3.47 (*)    Hemoglobin 9.4 (*)    HCT 28.3 (*)    Platelets 601 (*)    Neutro Abs 16.3 (*)    Monocytes Absolute 1.4 (*)    Abs Immature Granulocytes 0.14 (*)    All other components within normal limits  SARS CORONAVIRUS 2 (HOSPITAL ORDER, [place] [facility])  CULTURE, BLOOD (ROUTINE X 2)  CULTURE, BLOOD (ROUTINE X 2)  LACTIC ACID, PLASMA  URINALYSIS, ROUTINE W REFLEX MICROSCOPIC  HIV ANTIBODY (ROUTINE TESTING W REFLEX)  CBC  IRON AND TIBC  FERRITIN  GAMMA GT  BASIC METABOLIC PANEL  MAGNESIUM  OSMOLALITY  SODIUM, URINE, RANDOM  OSMOLALITY, URINE  LACTATE DEHYDROGENASE  TROPONIN I (HIGH SENSITIVITY)    EKG None  Radiology Ct Chest W Contrast  Result Date: 12/03/2018 CLINICAL DATA:  Right-sided chest pain persistent EXAM: CT CHEST WITH CONTRAST TECHNIQUE:  Multidetector CT imaging of the chest was performed during intravenous contrast administration. CONTRAST:  37mL OMNIPAQUE IOHEXOL 300 MG/ML  SOLN COMPARISON:  Chest x-ray 12/02/2018, 06/13/2017 FINDINGS: Cardiovascular: Aneurysmal  dilatation of the ascending aorta, measuring up to 4 cm, no change. No dissection seen. Mild aortic atherosclerosis. Borderline heart size. No significant pericardial effusion Mediastinum/Nodes: Midline trachea. No thyroid mass. Low right paratracheal lymph node measures 12 mm. Subcarinal lymph node measures 10 mm. Esophagus within normal limits. Lungs/Pleura: Right posterolateral gas and fluid collection abutting the pleural surface with thick rind and non simple fluid, consistent with empyema. This measures approximately 10 cm oblique AP by 5.3 cm transverse by 14.9 cm cranial caudad. This is contiguous with a smaller loculated gas and fluid collection at the central right lung base. Biapical fibrosis. 9 mm right upper lobe pulmonary nodule, series 5, image number 29. Subpleural ground-glass nodules within the lateral left base, series 5, image number 98 and the posterior left lung base, series 5, image number 101. Upper Abdomen: Subcentimeter hypodensity within the left hepatic lobe, too small to further characterize. Incompletely visualized right retroperitoneal mass measuring 4.5 cm, appears posterior to the right kidney which appears displaced anteriorly. Musculoskeletal: No acute or suspicious osseous abnormality IMPRESSION: 1. Large loculated gas and fluid collection within the right lateral and posterior thorax abutting the pleural surface, consistent with a large empyema. This measures approximately 10 x 5.3 x 14.9 cm and is contiguous with a smaller loculated fluid collection at the right lung base. 2. Right upper lobe 9 mm pulmonary nodule. Small subpleural ground-glass nodules in the left lower lobe which may be infectious, inflammatory, or possibly metastatic. 3. Mild  mediastinal adenopathy likely reactive 4. Incompletely visualized 4.5 cm right retroperitoneal mass posterior to the right kidney. Dedicated abdominopelvic CT suggested for further evaluation. 5. Stable 4 cm ascending thoracic aortic aneurysm. Aortic Atherosclerosis (ICD10-I70.0).Aortic aneurysm NOS (ICD10-I71.9). Electronically Signed   By: Donavan Foil M.D.   On: 12/03/2018 01:54    Procedures Procedures (including critical care time)   Medications Ordered in ED Medications  sodium chloride flush (NS) 0.9 % injection 3 mL (has no administration in time range)  amLODipine (NORVASC) tablet 10 mg (has no administration in time range)  levothyroxine (SYNTHROID) tablet 75 mcg (has no administration in time range)  acetaminophen (TYLENOL) tablet 650 mg (has no administration in time range)    Or  acetaminophen (TYLENOL) suppository 650 mg (has no administration in time range)  0.9 %  sodium chloride infusion (has no administration in time range)  potassium chloride SA (K-DUR) CR tablet 40 mEq (has no administration in time range)  piperacillin-tazobactam (ZOSYN) IVPB 3.375 g (has no administration in time range)  vancomycin (VANCOCIN) IVPB 1000 mg/200 mL premix (has no administration in time range)  vancomycin (VANCOCIN) IVPB 1000 mg/200 mL premix (0 mg Intravenous Stopped 12/03/18 0224)  ceFEPIme (MAXIPIME) 1 g in sodium chloride 0.9 % 100 mL IVPB (0 g Intravenous Stopped 12/03/18 0101)  sodium chloride 0.9 % bolus 1,000 mL (1,000 mLs Intravenous New Bag/Given 12/03/18 0020)  iohexol (OMNIPAQUE) 300 MG/ML solution 75 mL (75 mLs Intravenous Contrast Given 12/03/18 0118)     Initial Impression / Assessment and Plan / ED Course  I have reviewed the triage vital signs and the nursing notes.  Pertinent labs & imaging results that were available during my care of the patient were reviewed by me and considered in my medical decision making (see chart for details).        Patient presents with  cough and abnormal chest x-ray.  Lab work reviewed from triage.  He has hyponatremia, hypokalemia and a leukocytosis.  Chest x-ray reviewed from his primary  office which shows a large right-sided empyema.  Chest CT ordered.  Blood cultures obtained and patient was given vancomycin and cefepime.  COVID testing ordered as well.  CT scan confirms empyema.  Also noted to have a retroperitoneal mass that needs further imaging.  Patient was discussed with the hospitalist.  She is recommending interventional radiology consultation for possible drain in the morning.  I discussed this with Dr. Annamaria Boots who recommends placing an order for CT percutaneous drain.  This was communicated with Dr. Marlowe Sax.    Kevin [name] was evaluated in Emergency Department on 12/03/2018 for the symptoms described in the history of present illness. He was evaluated in the context of the global COVID-19 pandemic, which necessitated consideration that the patient might be at risk for infection with the SARS-CoV-2 virus that causes COVID-19. Institutional protocols and algorithms that pertain to the evaluation of patients at risk for COVID-19 are in a state of rapid change based on information released by regulatory bodies including the CDC and federal and state organizations. These policies and algorithms were followed during the patient's care in the ED.   Final Clinical Impressions(s) / ED Diagnoses   Final diagnoses:  Empyema [facility])  Hyponatremia    ED Discharge Orders    None       Dina Rich, Barbette Hair, MD 12/03/18 [phone]

## 2018-12-02 NOTE — ED Triage Notes (Signed)
Pt told he had a CXR and told he has infection to his R lung. Pt reports he has a bad tooth to the L side of his mouth that he thinks may possibly be infected. Pt reports he has increased WBC per PCP. Pt reports pain to R chest with exertion. Pt denies chills or fever.

## 2018-12-02 NOTE — ED Notes (Signed)
ED Provider at bedside. 

## 2018-12-03 ENCOUNTER — Inpatient Hospital Stay (HOSPITAL_COMMUNITY): Payer: BC Managed Care – PPO

## 2018-12-03 ENCOUNTER — Other Ambulatory Visit: Payer: Self-pay

## 2018-12-03 ENCOUNTER — Emergency Department (HOSPITAL_COMMUNITY): Payer: BC Managed Care – PPO

## 2018-12-03 ENCOUNTER — Encounter (HOSPITAL_COMMUNITY): Payer: Self-pay

## 2018-12-03 DIAGNOSIS — D473 Essential (hemorrhagic) thrombocythemia: Secondary | ICD-10-CM | POA: Diagnosis present

## 2018-12-03 DIAGNOSIS — Z923 Personal history of irradiation: Secondary | ICD-10-CM | POA: Diagnosis not present

## 2018-12-03 DIAGNOSIS — Z79899 Other long term (current) drug therapy: Secondary | ICD-10-CM | POA: Diagnosis not present

## 2018-12-03 DIAGNOSIS — E039 Hypothyroidism, unspecified: Secondary | ICD-10-CM | POA: Diagnosis present

## 2018-12-03 DIAGNOSIS — R911 Solitary pulmonary nodule: Secondary | ICD-10-CM | POA: Diagnosis present

## 2018-12-03 DIAGNOSIS — J869 Pyothorax without fistula: Principal | ICD-10-CM

## 2018-12-03 DIAGNOSIS — E876 Hypokalemia: Secondary | ICD-10-CM | POA: Diagnosis present

## 2018-12-03 DIAGNOSIS — E871 Hypo-osmolality and hyponatremia: Secondary | ICD-10-CM | POA: Diagnosis present

## 2018-12-03 DIAGNOSIS — N151 Renal and perinephric abscess: Secondary | ICD-10-CM | POA: Diagnosis present

## 2018-12-03 DIAGNOSIS — D649 Anemia, unspecified: Secondary | ICD-10-CM | POA: Diagnosis present

## 2018-12-03 DIAGNOSIS — Z79890 Hormone replacement therapy (postmenopausal): Secondary | ICD-10-CM | POA: Diagnosis not present

## 2018-12-03 DIAGNOSIS — Z9049 Acquired absence of other specified parts of digestive tract: Secondary | ICD-10-CM | POA: Diagnosis not present

## 2018-12-03 DIAGNOSIS — J90 Pleural effusion, not elsewhere classified: Secondary | ICD-10-CM | POA: Diagnosis present

## 2018-12-03 DIAGNOSIS — E43 Unspecified severe protein-calorie malnutrition: Secondary | ICD-10-CM | POA: Diagnosis present

## 2018-12-03 DIAGNOSIS — R05 Cough: Secondary | ICD-10-CM | POA: Diagnosis present

## 2018-12-03 DIAGNOSIS — E861 Hypovolemia: Secondary | ICD-10-CM | POA: Diagnosis present

## 2018-12-03 DIAGNOSIS — Z6821 Body mass index (BMI) 21.0-21.9, adult: Secondary | ICD-10-CM | POA: Diagnosis not present

## 2018-12-03 DIAGNOSIS — Z20828 Contact with and (suspected) exposure to other viral communicable diseases: Secondary | ICD-10-CM | POA: Diagnosis present

## 2018-12-03 DIAGNOSIS — Z85818 Personal history of malignant neoplasm of other sites of lip, oral cavity, and pharynx: Secondary | ICD-10-CM | POA: Diagnosis not present

## 2018-12-03 DIAGNOSIS — J948 Other specified pleural conditions: Secondary | ICD-10-CM | POA: Diagnosis present

## 2018-12-03 DIAGNOSIS — I10 Essential (primary) hypertension: Secondary | ICD-10-CM | POA: Diagnosis present

## 2018-12-03 LAB — IRON AND TIBC
Iron: 14 ug/dL — ABNORMAL LOW (ref 45–182)
Saturation Ratios: 9 % — ABNORMAL LOW (ref 17.9–39.5)
TIBC: 162 ug/dL — ABNORMAL LOW (ref 250–450)
UIBC: 148 ug/dL

## 2018-12-03 LAB — CBC
HCT: 31.8 % — ABNORMAL LOW (ref 39.0–52.0)
Hemoglobin: 10.4 g/dL — ABNORMAL LOW (ref 13.0–17.0)
MCH: 27.2 pg (ref 26.0–34.0)
MCHC: 32.7 g/dL (ref 30.0–36.0)
MCV: 83 fL (ref 80.0–100.0)
Platelets: 627 10*3/uL — ABNORMAL HIGH (ref 150–400)
RBC: 3.83 MIL/uL — ABNORMAL LOW (ref 4.22–5.81)
RDW: 14.4 % (ref 11.5–15.5)
WBC: 17.9 10*3/uL — ABNORMAL HIGH (ref 4.0–10.5)
nRBC: 0 % (ref 0.0–0.2)

## 2018-12-03 LAB — BASIC METABOLIC PANEL
Anion gap: 10 (ref 5–15)
BUN: 6 mg/dL — ABNORMAL LOW (ref 8–23)
CO2: 24 mmol/L (ref 22–32)
Calcium: 8.3 mg/dL — ABNORMAL LOW (ref 8.9–10.3)
Chloride: 100 mmol/L (ref 98–111)
Creatinine, Ser: 0.83 mg/dL (ref 0.61–1.24)
GFR calc Af Amer: >60 mL/min (ref >60)
GFR calc non Af Amer: >60 mL/min (ref >60)
Glucose, Bld: 104 mg/dL — ABNORMAL HIGH (ref 70–99)
Potassium: 3.1 mmol/L — ABNORMAL LOW (ref 3.5–5.1)
Sodium: 134 mmol/L — ABNORMAL LOW (ref 135–145)

## 2018-12-03 LAB — SODIUM, URINE, RANDOM: Sodium, Ur: 87 mmol/L

## 2018-12-03 LAB — URINALYSIS, ROUTINE W REFLEX MICROSCOPIC
Bilirubin Urine: NEGATIVE
Glucose, UA: NEGATIVE mg/dL
Hgb urine dipstick: NEGATIVE
Ketones, ur: NEGATIVE mg/dL
Leukocytes,Ua: NEGATIVE
Nitrite: NEGATIVE
Protein, ur: NEGATIVE mg/dL
Specific Gravity, Urine: 1.011 (ref 1.005–1.030)
pH: 7 (ref 5.0–8.0)

## 2018-12-03 LAB — HIV ANTIBODY (ROUTINE TESTING W REFLEX): HIV Screen 4th Generation wRfx: NONREACTIVE

## 2018-12-03 LAB — TROPONIN I (HIGH SENSITIVITY): Troponin I (High Sensitivity): 10 ng/L (ref <18)

## 2018-12-03 LAB — OSMOLALITY: Osmolality: 276 mOsm/kg (ref 275–295)

## 2018-12-03 LAB — LACTATE DEHYDROGENASE: LDH: 118 U/L (ref 98–192)

## 2018-12-03 LAB — GAMMA GT: GGT: 65 U/L — ABNORMAL HIGH (ref 7–50)

## 2018-12-03 LAB — FERRITIN: Ferritin: 1292 ng/mL — ABNORMAL HIGH (ref 24–336)

## 2018-12-03 LAB — MAGNESIUM: Magnesium: 1.8 mg/dL (ref 1.7–2.4)

## 2018-12-03 LAB — OSMOLALITY, URINE: Osmolality, Ur: 262 mOsm/kg — ABNORMAL LOW (ref 300–900)

## 2018-12-03 LAB — SARS CORONAVIRUS 2 BY RT PCR (HOSPITAL ORDER, PERFORMED IN ~~LOC~~ HOSPITAL LAB): SARS Coronavirus 2: NEGATIVE

## 2018-12-03 MED ORDER — ACETAMINOPHEN 650 MG RE SUPP: 650.0000 mg | Freq: Four times a day (QID) | RECTAL | Status: DC | PRN

## 2018-12-03 MED ORDER — VANCOMYCIN HCL IN DEXTROSE 1-5 GM/200ML-% IV SOLN
1000.0000 mg | INTRAVENOUS | Status: DC
  Administered 2018-12-04: 01:00:00 1000 mg via INTRAVENOUS
  Filled 2018-12-03: qty 200

## 2018-12-03 MED ORDER — FERROUS SULFATE 325 (65 FE) MG PO TABS
325.0000 mg | ORAL_TABLET | Freq: Two times a day (BID) | ORAL | Status: DC
  Administered 2018-12-03 – 2018-12-09 (×11): 325 mg via ORAL
  Filled 2018-12-03 (×12): qty 1

## 2018-12-03 MED ORDER — AMLODIPINE BESYLATE 10 MG PO TABS
10.0000 mg | ORAL_TABLET | Freq: Every day | ORAL | Status: DC
  Administered 2018-12-03 – 2018-12-13 (×11): 10 mg via ORAL
  Filled 2018-12-03: qty 2
  Filled 2018-12-03 (×4): qty 1
  Filled 2018-12-03: qty 2
  Filled 2018-12-03 (×5): qty 1

## 2018-12-03 MED ORDER — SODIUM CHLORIDE 0.9 % IV SOLN
INTRAVENOUS | Status: AC
  Administered 2018-12-03: 04:00:00 via INTRAVENOUS

## 2018-12-03 MED ORDER — POTASSIUM CHLORIDE CRYS ER 20 MEQ PO TBCR
40.0000 meq | EXTENDED_RELEASE_TABLET | Freq: Once | ORAL | Status: AC
  Administered 2018-12-03: 04:00:00 40 meq via ORAL
  Filled 2018-12-03: qty 2

## 2018-12-03 MED ORDER — ACETAMINOPHEN 325 MG PO TABS
650.0000 mg | ORAL_TABLET | Freq: Four times a day (QID) | ORAL | Status: DC | PRN
  Administered 2018-12-04 – 2018-12-13 (×32): 650 mg via ORAL
  Filled 2018-12-03 (×35): qty 2

## 2018-12-03 MED ORDER — LEVOTHYROXINE SODIUM 75 MCG PO TABS
75.0000 ug | ORAL_TABLET | Freq: Every day | ORAL | Status: DC
  Administered 2018-12-03 – 2018-12-13 (×10): 75 ug via ORAL
  Filled 2018-12-03 (×10): qty 1

## 2018-12-03 MED ORDER — GADOBUTROL 1 MMOL/ML IV SOLN
7.5000 mL | Freq: Once | INTRAVENOUS | Status: AC | PRN
  Administered 2018-12-04: 6 mL via INTRAVENOUS

## 2018-12-03 MED ORDER — PIPERACILLIN-TAZOBACTAM 3.375 G IVPB
3.3750 g | Freq: Three times a day (TID) | INTRAVENOUS | Status: DC
  Administered 2018-12-03 – 2018-12-08 (×16): 3.375 g via INTRAVENOUS
  Filled 2018-12-03 (×17): qty 50

## 2018-12-03 MED ORDER — POTASSIUM CHLORIDE CRYS ER 20 MEQ PO TBCR
40.0000 meq | EXTENDED_RELEASE_TABLET | Freq: Once | ORAL | Status: AC
  Administered 2018-12-03: 40 meq via ORAL
  Filled 2018-12-03: qty 2

## 2018-12-03 MED ORDER — IOHEXOL 300 MG/ML  SOLN
75.0000 mL | Freq: Once | INTRAMUSCULAR | Status: AC | PRN
  Administered 2018-12-03: 75 mL via INTRAVENOUS

## 2018-12-03 NOTE — Consult Note (Addendum)
Chief Complaint: Patient was seen in consultation today for right empyema/right chest tube placement.  Referring Physician(s): Kayleen Memos  Supervising Physician: Corrie Mckusick  Patient Status: [facility] - In-pt  History of Present Illness: Kevin [name] is a 63 y.o. male with a past medical history of hypertension, oropharynx cancer s/p radiation, and hypothyroidism. He presented to [facility] ED today after being sent by his PCP for evaluation of cough/chest discomfort and abnormal labs (WBCs elevated). In ED, CT chest demonstrated a large loculated gas and fluid collection within the right lateral and posterior thorax abutting the pleural surface, consistent with a large empyema measuring approximately 10 x 5.3 x 14.9 cm and is contiguous with a smaller loculated fluid collection at the right lung base. He was admitted for further management. CT surgery was consulted who recommended IR consultation for possible right chest tube placement.  CT chest this AM: 1. Large loculated gas and fluid collection within the right lateral and posterior thorax abutting the pleural surface, consistent with a large empyema. This measures approximately 10 x 5.3 x 14.9 cm and is contiguous with a smaller loculated fluid collection at the right lung base. 2. Right upper lobe 9 mm pulmonary nodule. Small subpleural ground-glass nodules in the left lower lobe which may be infectious, inflammatory, or possibly metastatic. 3. Mild mediastinal adenopathy likely reactive 4. Incompletely visualized 4.5 cm right retroperitoneal mass posterior to the right kidney. Dedicated abdominopelvic CT suggested for further evaluation. 5. Stable 4 cm ascending thoracic aortic aneurysm.  IR requested by Dr. [place] Crane for possible image-guided right chest tube placement. Patient awake and alert sitting in bed with no complaints at this time. Denies fever, chills, chest pain, dyspnea, abdominal pain, or headache.   Past Medical History:    Diagnosis Date   Hx of radiation therapy 04/05/10 to 05/25/10   head/neck   Hypertension    Hypothyroid 08/27/2011   Oropharynx cancer ([facility]) 11-20-2009   TONSIL   Pulmonary nodule 04/21/2012    Past Surgical History:  Procedure Laterality Date   APACO     with tonsillar cancer   APPENDECTOMY     childhood   COLONOSCOPY     NECK LESION BIOPSY  2011    Dx w/ tonsil cancer    WISDOM TOOTH EXTRACTION      Allergies: Patient has no known allergies.  Medications: Prior to Admission medications   Medication Sig Start Date End Date Taking? Authorizing Provider  amLODipine (NORVASC) 10 MG tablet Take 10 mg by mouth daily.  05/22/16  Yes [provider]  levothyroxine (SYNTHROID) 75 MCG tablet Take 75 mcg by mouth daily before breakfast.   Yes [provider]     Family History  Problem Relation Age of Onset   Stroke Father        dx in his 64s   CAD Neg Hx    Colon cancer Neg Hx    Prostate cancer Neg Hx    Diabetes Neg Hx    Colon polyps Neg Hx    Rectal cancer Neg Hx    Stomach cancer Neg Hx    Esophageal cancer Neg Hx     Social History   Socioeconomic History   Marital status: Married    Spouse name: Not on file   Number of children: 4   Years of education: Not on file   Highest education level: Not on file  Occupational History   Occupation: Product/process development scientist: [employer]  Social Designer, fashion/clothing strain: Not on file   Food insecurity    Worry: Not on file    Inability: Not on file   Transportation needs    Medical: Not on file    Non-medical: Not on file  Tobacco Use   Smoking status: Never Smoker   Smokeless tobacco: Never Used  Substance and Sexual Activity   Alcohol use: Yes    Comment: occassionally   Drug use: No   Sexual activity: Not on file  Lifestyle   Physical activity    Days per week: Not on file    Minutes per session: Not on file   Stress: Not on file   Relationships   Social connections    Talks on phone: Not on file    Gets together: Not on file    Attends religious service: Not on file    Active member of club or organization: Not on file    Attends meetings of clubs or organizations: Not on file    Relationship status: Not on file  Other Topics Concern   Not on file  Social History Narrative   Married, 4 children, 3 living        Review of Systems: A 12 point ROS discussed and pertinent positives are indicated in the HPI above.  All other systems are negative.  Review of Systems  Constitutional: Negative for chills and fever.  Respiratory: Negative for shortness of breath and wheezing.   Cardiovascular: Negative for chest pain and palpitations.  Gastrointestinal: Negative for abdominal pain.  Neurological: Negative for headaches.  Psychiatric/Behavioral: Negative for behavioral problems and confusion.    Vital Signs: BP 120/74    Pulse (!) 102    Temp 98.2 F (36.8 C) (Oral)    Resp (!) 23    Wt 130 lb (59 kg)    SpO2 96%    BMI 20.98 kg/m   Physical Exam Vitals signs and nursing note reviewed.  Constitutional:      General: He is not in acute distress.    Appearance: Normal appearance.  Cardiovascular:     Rate and Rhythm: Normal rate and regular rhythm.     Heart sounds: Normal heart sounds. No murmur.  Pulmonary:     Effort: Pulmonary effort is normal. No respiratory distress.     Breath sounds: Normal breath sounds. No wheezing.  Skin:    General: Skin is warm and dry.  Neurological:     Mental Status: He is alert and oriented to person, place, and time.  Psychiatric:        Mood and Affect: Mood normal.        Behavior: Behavior normal.        Thought Content: Thought content normal.        Judgment: Judgment normal.      MD Evaluation Airway: WNL Heart: WNL Abdomen: WNL Chest/ Lungs: WNL ASA  Classification: 3 Mallampati/Airway Score: Two   Imaging: Ct Abdomen Pelvis Wo  Contrast  Result Date: 12/03/2018 CLINICAL DATA:  Cough and leukocytosis. 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 Abnormal chest CT. EXAM: CT ABDOMEN AND PELVIS WITHOUT CONTRAST TECHNIQUE: Multidetector CT imaging of the abdomen and pelvis was performed following the standard protocol without IV contrast. COMPARISON:  PET-CT 08/22/2010.  Chest CT earlier today. FINDINGS: Lower chest: Loculated air-fluid collection laterally in the right inferior hemithorax is unchanged from the earlier chest CT, measuring up to 11.4 x 5.2 cm transverse on image  5/3 and consistent with empyema. There is underlying centrally cavitary airspace disease in the adjacent right lower lobe as well as compressive atelectasis. Minimal subpleural nodularity is present at the left lung base (image 18/3). No significant left pleural effusion. Hepatobiliary: The liver appears unremarkable as imaged in the noncontrast state. There is high-density material within the gallbladder lumen but no evidence of gallstones, gallbladder wall thickening or biliary dilatation. Pancreas: Unremarkable. No pancreatic ductal dilatation or surrounding inflammatory changes. Spleen: Normal in size without focal abnormality. Adrenals/Urinary Tract: Both adrenal glands appear normal. Retroperitoneal mass involving the right posterior perinephric space is completely imaged on this study, measuring 7.7 x 5.8 cm transverse on image 38/2. This extends 8.3 cm cephalad caudal on image 45/6. This mass abuts and deforms the posterior aspect of the right kidney which is displaced anteriorly. There are internal locules of fat within this lesion which otherwise measures soft tissue and fluid density. The lesion is incompletely characterized on this noncontrast study. The left kidney appears normal. No evidence of urinary tract calculus or hydronephrosis. There is contrast within the urinary bladder from the earlier chest CT. Stomach/Bowel: No evidence of bowel  wall thickening, distention or surrounding inflammatory change. Vascular/Lymphatic: There are no enlarged abdominal or pelvic lymph nodes. There are small retroperitoneal lymph nodes. Aortic and branch vessel atherosclerosis. Reproductive: Mild enlargement of the prostate gland. Other: A small amount of free pelvic fluid is present. There is mild edema throughout the deep and subcutaneous fat consistent with anasarca. No other focal fluid collections are identified. Musculoskeletal: No acute or significant osseous findings. Stable sclerotic lesion medially in the left iliac bone. IMPRESSION: 1. The mass posterior to the right kidney seen on previous chest CT is not further characterized by this noncontrast CT, although is now fully imaged. Based on central low density and appearance on earlier CT, this appears necrotic and could reflect a retroperitoneal abscess based on the chest findings. Necrotic tumor and subacute hematoma less likely. 2. No other significant findings identified within the abdomen or pelvis. 3. Complex air-fluid collection laterally in the right hemithorax again noted consistent with empyema. Airspace opacities at both lung bases. Electronically Signed   By: Richardean Sale M.D.   On: 12/03/2018 10:35   Ct Chest W Contrast  Result Date: 12/03/2018 CLINICAL DATA:  Right-sided chest pain persistent EXAM: CT CHEST WITH CONTRAST TECHNIQUE: Multidetector CT imaging of the chest was performed during intravenous contrast administration. CONTRAST:  96mL OMNIPAQUE IOHEXOL 300 MG/ML  SOLN COMPARISON:  Chest x-ray 12/02/2018, 06/13/2017 FINDINGS: Cardiovascular: Aneurysmal dilatation of the ascending aorta, measuring up to 4 cm, no change. No dissection seen. Mild aortic atherosclerosis. Borderline heart size. No significant pericardial effusion Mediastinum/Nodes: Midline trachea. No thyroid mass. Low right paratracheal lymph node measures 12 mm. Subcarinal lymph node measures 10 mm. Esophagus within  normal limits. Lungs/Pleura: Right posterolateral gas and fluid collection abutting the pleural surface with thick rind and non simple fluid, consistent with empyema. This measures approximately 10 cm oblique AP by 5.3 cm transverse by 14.9 cm cranial caudad. This is contiguous with a smaller loculated gas and fluid collection at the central right lung base. Biapical fibrosis. 9 mm right upper lobe pulmonary nodule, series 5, image number 29. Subpleural ground-glass nodules within the lateral left base, series 5, image number 98 and the posterior left lung base, series 5, image number 101. Upper Abdomen: Subcentimeter hypodensity within the left hepatic lobe, too small to further characterize. Incompletely visualized right retroperitoneal mass measuring 4.5 cm,  appears posterior to the right kidney which appears displaced anteriorly. Musculoskeletal: No acute or suspicious osseous abnormality IMPRESSION: 1. Large loculated gas and fluid collection within the right lateral and posterior thorax abutting the pleural surface, consistent with a large empyema. This measures approximately 10 x 5.3 x 14.9 cm and is contiguous with a smaller loculated fluid collection at the right lung base. 2. Right upper lobe 9 mm pulmonary nodule. Small subpleural ground-glass nodules in the left lower lobe which may be infectious, inflammatory, or possibly metastatic. 3. Mild mediastinal adenopathy likely reactive 4. Incompletely visualized 4.5 cm right retroperitoneal mass posterior to the right kidney. Dedicated abdominopelvic CT suggested for further evaluation. 5. Stable 4 cm ascending thoracic aortic aneurysm. Aortic Atherosclerosis (ICD10-I70.0).Aortic aneurysm NOS (ICD10-I71.9). Electronically Signed   By: Donavan Foil M.D.   On: 12/03/2018 01:54    Labs:  CBC: Recent Labs    12/02/18 1645 12/03/18 0336  WBC 18.9* 17.9*  HGB 9.4* 10.4*  HCT 28.3* 31.8*  PLT 601* 627*    COAGS: No results for input(s): INR, APTT  in the last 8760 hours.  BMP: Recent Labs    12/02/18 1800 12/03/18 0336  NA 129* 134*  K 3.1* 3.1*  CL 97* 100  CO2 21* 24  GLUCOSE 136* 104*  BUN 10 6*  CALCIUM 8.1* 8.3*  CREATININE 0.91 0.83  GFRNONAA >60 >60  GFRAA >60 >60    LIVER FUNCTION TESTS: Recent Labs    12/02/18 1800  BILITOT 0.3  AST 21  ALT 17  ALKPHOS 136*  PROT 6.3*  ALBUMIN 2.0*     Assessment and Plan:  Right empyema. Plan for image-guided right chest tube placement tentatively for tomorrow 12/04/2018 with Dr. Laurence Ferrari. Patient will be NPO at midnight. Afebrile. He does not take blood thinners. INR pending for 0500 tomorrow. COVID negative today.  Risks and benefits discussed with the patient including bleeding, infection, damage to adjacent structures, and sepsis. All of the patient's questions were answered, patient is agreeable to proceed. Consent signed and in chart.   Thank you for this interesting consult.  I greatly enjoyed meeting Kevin [name] and look forward to participating in their care.  A copy of this report was sent to the requesting provider on this date.  Electronically Signed: Earley Abide, PA-C 12/03/2018, 4:47 PM   I spent a total of 40 Minutes in face to face in clinical consultation, greater than 50% of which was counseling/coordinating care for right empyema/right chest tube placement.

## 2018-12-03 NOTE — ED Notes (Signed)
Paged admitting per rn  

## 2018-12-03 NOTE — H&P (Signed)
History and Physical    Kevin [name] EKC:[id number] DOB: 21-Oct-1955 DOA: 12/02/2018  PCP: Berkley Harvey, NP  Chief Complaint: Cough, abnormal labs and chest x-ray  HPI: Kevin [name] is a 63 y.o. 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  Patient states he has been coughing for the past 2 days.  He previously had slight discomfort in 1 of his ribs on the right side.  Denies any left-sided or substernal chest pain/pressure.  Denies any injuries to his ribs.  States he went to his PCPs office yesterday and was told his white blood cell count was high on labs.  He was also told that his chest x-ray showed an infection.  He was advised to come into the ED for further evaluation.  Denies any fevers or chills.  Reports feeling slightly short of breath with exertion for the past few days.  Denies any nausea, vomiting, abdominal pain, or diarrhea.  No other complaints.  ED Course: Afebrile and hemodynamically stable.  Not hypoxic.  White count 18.9 with left shift.  Lactic acid normal.  Hemoglobin 9.4 and MCV 81.6, no recent baseline.  Platelet count 601,000.  Corrected sodium 130.  Potassium 3.1.  Alkaline phosphatase 136, remainder of LFTs normal.  Blood culture x2 pending.  COVID-19 rapid test pending.  Chest CT showing a large loculated gas and fluid collection within the right lateral and posterior thorax abutting the pleural surface, consistent with a large empyema measuring approximately 10 x 5.3 x 14.9 cm and is contiguous with a smaller loculated fluid collection at the right lung base. Patient received vancomycin, cefepime, and 1 L IV fluid bolus in the ED.  Review of Systems:  All systems reviewed and apart from history of presenting illness, are negative.  Past Medical History:  Diagnosis Date  . Hx of  radiation therapy 04/05/10 to 05/25/10   head/neck  . Hypertension   . Hypothyroid 08/27/2011  . 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 Pulmonary nodule 04/21/2012    Past Surgical History:  Procedure Laterality Date  . APACO     with tonsillar cancer  . APPENDECTOMY     childhood  . COLONOSCOPY    . NECK LESION BIOPSY  2011    Dx w/ tonsil cancer   . WISDOM TOOTH EXTRACTION       reports that he has never smoked. He has never used smokeless tobacco. He reports current alcohol use. He reports that he does not use drugs.  No Known Allergies  Family History  Problem Relation Age of Onset  . Stroke Father        dx in his 78s  . CAD Neg Hx   . Colon cancer Neg Hx   . Prostate cancer Neg Hx   . Diabetes Neg Hx   . Colon polyps Neg Hx   . Rectal cancer Neg Hx   . Stomach cancer Neg Hx   . Esophageal cancer Neg Hx     Prior to Admission medications   Medication Sig Start Date End Date Taking? Authorizing Provider  amLODipine (NORVASC) 10 MG tablet TK 1 T PO D 05/22/16   [provider]  levothyroxine (SYNTHROID) 75 MCG tablet Take 75 mcg by mouth daily before breakfast.    [provider]    Physical Exam: Vitals:   12/03/18 0145  12/03/18 0215 12/03/18 0230 12/03/18 0245  BP:  122/75 134/74 135/79  Pulse: 96 81 81 87  Resp: 16 18 13 15   Temp:      TempSrc:      SpO2: 96% 93% 95% 95%  Weight:  59 kg      Physical Exam  Constitutional: He is oriented to person, place, and time. He appears well-developed and well-nourished. No distress.  HENT:  Head: Normocephalic.  Mouth/Throat: Oropharynx is clear and moist.  Eyes: Right eye exhibits no discharge. Left eye exhibits no discharge.  Neck: Neck supple.  Cardiovascular: Normal rate, regular rhythm and intact distal pulses.  Pulmonary/Chest: Effort normal. No respiratory distress. He has no wheezes.  Breath sounds slightly reduced at the right lung base with mild crackles  Abdominal: Soft.  Bowel sounds are normal. He exhibits no distension. There is no abdominal tenderness. There is no guarding.  Musculoskeletal:     Comments: Right ankle swollen (chronic per patient secondary to injury)  Neurological: He is alert and oriented to person, place, and time.  Skin: Skin is warm and dry. He is not diaphoretic.     Labs on Admission: I have personally reviewed following labs and imaging studies  CBC: Recent Labs  Lab 12/02/18 1645 12/03/18 0336  WBC 18.9* 17.9*  NEUTROABS 16.3*  --   HGB 9.4* 10.4*  HCT 28.3* 31.8*  MCV 81.6 83.0  PLT 601* 161*   Basic Metabolic Panel: Recent Labs  Lab 12/02/18 1800 12/03/18 0336  NA 129* 134*  K 3.1* 3.1*  CL 97* 100  CO2 21* 24  GLUCOSE 136* 104*  BUN 10 6*  CREATININE 0.91 0.83  CALCIUM 8.1* 8.3*  MG  --  1.8   GFR: Estimated Creatinine Clearance: 78 mL/min (by C-G formula based on SCr of 0.83 mg/dL). Liver Function Tests: Recent Labs  Lab 12/02/18 1800  AST 21  ALT 17  ALKPHOS 136*  BILITOT 0.3  PROT 6.3*  ALBUMIN 2.0*   No results for input(s): LIPASE, AMYLASE in the last 168 hours. No results for input(s): AMMONIA in the last 168 hours. Coagulation Profile: No results for input(s): INR, PROTIME in the last 168 hours. Cardiac Enzymes: No results for input(s): CKTOTAL, CKMB, CKMBINDEX, TROPONINI in the last 168 hours. BNP (last 3 results) No results for input(s): PROBNP in the last 8760 hours. HbA1C: No results for input(s): HGBA1C in the last 72 hours. CBG: No results for input(s): GLUCAP in the last 168 hours. Lipid Profile: No results for input(s): CHOL, HDL, LDLCALC, TRIG, CHOLHDL, LDLDIRECT in the last 72 hours. Thyroid Function Tests: No results for input(s): TSH, T4TOTAL, FREET4, T3FREE, THYROIDAB in the last 72 hours. Anemia Panel: Recent Labs    12/03/18 0336  FERRITIN 1,292*  TIBC 162*  IRON 14*   Urine analysis:    Component Value Date/Time   COLORURINE STRAW (A) 12/03/2018 0343    APPEARANCEUR CLEAR 12/03/2018 0343   LABSPEC 1.011 12/03/2018 0343   PHURINE 7.0 12/03/2018 0343   GLUCOSEU NEGATIVE 12/03/2018 0343   HGBUR NEGATIVE 12/03/2018 0343   BILIRUBINUR NEGATIVE 12/03/2018 0343   KETONESUR NEGATIVE 12/03/2018 0343   PROTEINUR NEGATIVE 12/03/2018 0343   NITRITE NEGATIVE 12/03/2018 0343   LEUKOCYTESUR NEGATIVE 12/03/2018 0343    Radiological Exams on Admission: Ct Chest W Contrast  Result Date: 12/03/2018 CLINICAL DATA:  Right-sided chest pain persistent EXAM: CT CHEST WITH CONTRAST TECHNIQUE: Multidetector CT imaging of the chest was performed during intravenous contrast administration. CONTRAST:  48mL  OMNIPAQUE IOHEXOL 300 MG/ML  SOLN COMPARISON:  Chest x-ray 12/02/2018, 06/13/2017 FINDINGS: Cardiovascular: Aneurysmal dilatation of the ascending aorta, measuring up to 4 cm, no change. No dissection seen. Mild aortic atherosclerosis. Borderline heart size. No significant pericardial effusion Mediastinum/Nodes: Midline trachea. No thyroid mass. Low right paratracheal lymph node measures 12 mm. Subcarinal lymph node measures 10 mm. Esophagus within normal limits. Lungs/Pleura: Right posterolateral gas and fluid collection abutting the pleural surface with thick rind and non simple fluid, consistent with empyema. This measures approximately 10 cm oblique AP by 5.3 cm transverse by 14.9 cm cranial caudad. This is contiguous with a smaller loculated gas and fluid collection at the central right lung base. Biapical fibrosis. 9 mm right upper lobe pulmonary nodule, series 5, image number 29. Subpleural ground-glass nodules within the lateral left base, series 5, image number 98 and the posterior left lung base, series 5, image number 101. Upper Abdomen: Subcentimeter hypodensity within the left hepatic lobe, too small to further characterize. Incompletely visualized right retroperitoneal mass measuring 4.5 cm, appears posterior to the right kidney which appears displaced  anteriorly. Musculoskeletal: No acute or suspicious osseous abnormality IMPRESSION: 1. Large loculated gas and fluid collection within the right lateral and posterior thorax abutting the pleural surface, consistent with a large empyema. This measures approximately 10 x 5.3 x 14.9 cm and is contiguous with a smaller loculated fluid collection at the right lung base. 2. Right upper lobe 9 mm pulmonary nodule. Small subpleural ground-glass nodules in the left lower lobe which may be infectious, inflammatory, or possibly metastatic. 3. Mild mediastinal adenopathy likely reactive 4. Incompletely visualized 4.5 cm right retroperitoneal mass posterior to the right kidney. Dedicated abdominopelvic CT suggested for further evaluation. 5. Stable 4 cm ascending thoracic aortic aneurysm. Aortic Atherosclerosis (ICD10-I70.0).Aortic aneurysm NOS (ICD10-I71.9). Electronically Signed   By: Donavan Foil M.D.   On: 12/03/2018 01:54    Assessment/Plan Principal Problem:   Empyema (HCC) Active Problems:   Hypothyroid   Pulmonary nodule   Anemia   Hyponatremia   Large right-sided empyema Afebrile and hemodynamically stable.  Not hypoxic.  White count 18.9 with left shift.  Lactic acid normal.  COVID-19 rapid test negative.  Chest CT showing a large loculated gas and fluid collection within the right lateral and posterior thorax abutting the pleural surface, consistent with a large empyema measuring approximately 10 x 5.3 x 14.9 cm and is contiguous with a smaller loculated fluid collection at the right lung base.  -Vancomycin and Zosyn -ED provider discussed the case with IR, will consult in a.m. -Orders for IR guided thoracentesis and pleural fluid analysis have been placed -Blood culture x2 pending -Keep n.p.o.  Normocytic anemia Hemoglobin 9.4 and MCV 81.6, no recent baseline.  No signs of active bleeding. -Check iron, ferritin, TIBC  Thrombocytosis Possibly reactive secondary to infection. Platelet count  601,000. -Repeat CBC in a.m.  Hyponatremia Corrected sodium 130.   -IV fluid hydration -Check serum osmolarity -Check urine sodium, osmolarity  Hypokalemia Potassium 3.1.  Magnesium normal. -Replete potassium.  Continue to monitor BMP.  Slightly elevated alkaline phosphatase Alkaline phosphatase 136, remainder of LFTs normal.   -Check GGT to differentiate between intrahepatic versus extrahepatic cause of elevated alkaline phosphatase  Pulmonary nodule and retroperitoneal mass CT showing a right upper lobe 9 mm pulmonary nodule.  Small subpleural groundglass nodules in the left lower lobe which may be infectious, inflammatory, or possibly metastatic.  CT also showing an incompletely visualized 4.5 cm right retroperitoneal mass posterior to the  right kidney. -CT abdomen pelvis has been ordered for further evaluation of retroperitoneal mass -Patient will need further work-up of pulmonary nodules after management of empyema.  Hypertension -Continue home amlodipine  Hypothyroidism -Continue home Synthroid  DVT prophylaxis: SCDs at this time in anticipation of procedure. Code Status: Patient wishes to be full code. Family Communication: No family available at this time. Disposition Plan: Anticipate discharge after clinical improvement. Consults called: IR Admission status: It is my clinical opinion that admission to INPATIENT is reasonable and necessary in this 63 y.o. male . presenting with leukocytosis and chest CT findings consistent with large right-sided empyema.  Will need IR guided thoracentesis and treatment with IV antibiotics.  Given the aforementioned, the predictability of an adverse outcome is felt to be significant. I expect that the patient will require at least 2 midnights in the hospital to treat this condition.   The medical decision making on this patient was of high complexity and the patient is at high risk for clinical deterioration, therefore this is a level 3  visit.  Shela Leff MD [facility] Pager [phone]  If 7PM-7AM, please contact night-coverage [email or web address] Password [facility]  12/03/2018, 4:38 AM

## 2018-12-03 NOTE — ED Notes (Signed)
ED TO INPATIENT HANDOFF REPORT  ED Nurse Name and Phone #:   S Name/Age/Gender Kevin [name] 63 y.o. male Room/Bed: 057C/057C  Code Status   Code Status: Full Code  Home/SNF/Other Home Patient oriented to: self, place, time and situation Is this baseline? Yes   Triage Complete: Triage complete  Chief Complaint Abn Labs  Triage Note Pt told he had a CXR and told he has infection to his R lung. Pt reports he has a bad tooth to the L side of his mouth that he thinks may possibly be infected. Pt reports he has increased WBC per PCP. Pt reports pain to R chest with exertion. Pt denies chills or fever.    Allergies No Known Allergies  Level of Care/Admitting Diagnosis ED Disposition    ED Disposition Condition [facility]: [place] [[id number]]  Level of Care: Telemetry Medical [104]  Covid Evaluation: Person Under Investigation (PUI)  Diagnosis: Empyema [facility]) [[id number]]  Admitting Physician: Shela Leff [[id number]]  Attending Physician: Shela Leff [[id number]]  Estimated length of stay: past midnight tomorrow  Certification:: I certify this patient will need inpatient services for at least 2 midnights  PT Class (Do Not Modify): Inpatient [101]  PT Acc Code (Do Not Modify): Private [1]       B Medical/Surgery History Past Medical History:  Diagnosis Date  . Hx of radiation therapy 04/05/10 to 05/25/10   head/neck  . Hypertension   . Hypothyroid 08/27/2011  . 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 Pulmonary nodule 04/21/2012   Past Surgical History:  Procedure Laterality Date  . APACO     with tonsillar cancer  . APPENDECTOMY     childhood  . COLONOSCOPY    . NECK LESION BIOPSY  2011    Dx w/ tonsil cancer   . WISDOM TOOTH EXTRACTION       A IV Location/Drains/Wounds Patient Lines/Drains/Airways Status   Active Line/Drains/Airways    Name:   Placement date:   Placement time:   Site:   Days:    Peripheral IV 12/03/18 Right Forearm   12/03/18    0019    Forearm   less than 1          Intake/Output Last 24 hours  Intake/Output Summary (Last 24 hours) at 12/03/2018 1620 Last data filed at 12/03/2018 1328 Gross per 24 hour  Intake 1050 ml  Output 1800 ml  Net -750 ml    Labs/Imaging Results for orders placed or performed during the hospital encounter of 12/02/18 (from the past 48 hour(s))  Lactic acid, plasma     Status: None   Collection Time: 12/02/18  4:45 PM  Result Value Ref Range   Lactic Acid, Venous 1.0 0.5 - 1.9 mmol/L    Comment: Performed at [name], [address] [place], [place] [address]  CBC with Differential     Status: Abnormal   Collection Time: 12/02/18  4:45 PM  Result Value Ref Range   WBC 18.9 (H) 4.0 - 10.5 K/uL   RBC 3.47 (L) 4.22 - 5.81 MIL/uL   Hemoglobin 9.4 (L) 13.0 - 17.0 g/dL   HCT 28.3 (L) 39.0 - 52.0 %   MCV 81.6 80.0 - 100.0 fL   MCH 27.1 26.0 - 34.0 pg   MCHC 33.2 30.0 - 36.0 g/dL   RDW 14.4 11.5 - 15.5 %   Platelets 601 (H) 150 - 400 K/uL   nRBC 0.0  0.0 - 0.2 %   Neutrophils Relative % 86 %   Neutro Abs 16.3 (H) 1.7 - 7.7 K/uL   Lymphocytes Relative 5 %   Lymphs Abs 0.9 0.7 - 4.0 K/uL   Monocytes Relative 8 %   Monocytes Absolute 1.4 (H) 0.1 - 1.0 K/uL   Eosinophils Relative 0 %   Eosinophils Absolute 0.1 0.0 - 0.5 K/uL   Basophils Relative 0 %   Basophils Absolute 0.1 0.0 - 0.1 K/uL   Immature Granulocytes 1 %   Abs Immature Granulocytes 0.14 (H) 0.00 - 0.07 K/uL    Comment: Performed at [facility] [address], [place], [place] [address]  Comprehensive metabolic panel     Status: Abnormal   Collection Time: 12/02/18  6:00 PM  Result Value Ref Range   Sodium 129 (L) 135 - 145 mmol/L   Potassium 3.1 (L) 3.5 - 5.1 mmol/L   Chloride 97 (L) 98 - 111 mmol/L   CO2 21 (L) 22 - 32 mmol/L   Glucose, Bld 136 (H) 70 - 99 mg/dL   BUN 10 8 - 23 mg/dL   Creatinine, Ser 0.91 0.61 - 1.24 mg/dL   Calcium 8.1 (L)  8.9 - 10.3 mg/dL   Total Protein 6.3 (L) 6.5 - 8.1 g/dL   Albumin 2.0 (L) 3.5 - 5.0 g/dL   AST 21 15 - 41 U/L   ALT 17 0 - 44 U/L   Alkaline Phosphatase 136 (H) 38 - 126 U/L   Total Bilirubin 0.3 0.3 - 1.2 mg/dL   GFR calc non Af Amer >60 >60 mL/min   GFR calc Af Amer >60 >60 mL/min   Anion gap 11 5 - 15    Comment: Performed at [facility], [place] [address], [place], [place] [address]  SARS Coronavirus 2 (CEPHEID- Performed in [facility] hospital lab), Hosp Order     Status: None   Collection Time: 12/03/18  1:04 AM   Specimen: Nasopharyngeal Swab  Result Value Ref Range   SARS Coronavirus 2 NEGATIVE NEGATIVE    Comment: (NOTE) If result is NEGATIVE SARS-CoV-2 target nucleic acids are NOT DETECTED. The SARS-CoV-2 RNA is generally detectable in upper and lower  respiratory specimens during the acute phase of infection. The lowest  concentration of SARS-CoV-2 viral copies this assay can detect is 250  copies / mL. A negative result does not preclude SARS-CoV-2 infection  and should not be used as the sole basis for treatment or other  patient management decisions.  A negative result may occur with  improper specimen collection / handling, submission of specimen other  than nasopharyngeal swab, presence of viral mutation(s) within the  areas targeted by this assay, and inadequate number of viral copies  (<250 copies / mL). A negative result must be combined with clinical  observations, patient history, and epidemiological information. If result is POSITIVE SARS-CoV-2 target nucleic acids are DETECTED. The SARS-CoV-2 RNA is generally detectable in upper and lower  respiratory specimens dur ing the acute phase of infection.  Positive  results are indicative of active infection with SARS-CoV-2.  Clinical  correlation with patient history and other diagnostic information is  necessary to determine patient infection status.  Positive results do  not rule out bacterial infection  or co-infection with other viruses. If result is PRESUMPTIVE POSTIVE SARS-CoV-2 nucleic acids MAY BE PRESENT.   A presumptive positive result was obtained on the submitted specimen  and confirmed on repeat testing.  While 2019  novel coronavirus  (SARS-CoV-2) nucleic acids may be present in the submitted sample  additional confirmatory testing may be necessary for epidemiological  and / or clinical management purposes  to differentiate between  SARS-CoV-2 and other Sarbecovirus currently known to infect humans.  If clinically indicated additional testing with an alternate test  methodology [phone]) is advised. The SARS-CoV-2 RNA is generally  detectable in upper and lower respiratory sp ecimens during the acute  phase of infection. The expected result is Negative. Fact Sheet for Patients:  StrictlyIdeas.no Fact Sheet for Healthcare Providers: BankingDealers.co.za This test is not yet approved or cleared by the [place] FDA and has been authorized for detection and/or diagnosis of SARS-CoV-2 by FDA under an Emergency Use Authorization (EUA).  This EUA will remain in effect (meaning this test can be used) for the duration of the COVID-19 declaration under Section 564(b)(1) of the Act, 21 U.S.C. section 360bbb-3(b)(1), unless the authorization is terminated or revoked sooner. Performed at [facility], [place] [address], [place], [place] [address]   Sodium, urine, random     Status: None   Collection Time: 12/03/18  2:17 AM  Result Value Ref Range   Sodium, Ur 87 mmol/L    Comment: Performed at [facility] [address], [place], [place] [address]  Osmolality, urine     Status: Abnormal   Collection Time: 12/03/18  2:18 AM  Result Value Ref Range   Osmolality, Ur 262 (L) 300 - 900 mOsm/kg    Comment: Performed at [facility] [address], [place], [place] [address]  HIV antibody (Routine Testing)     Status:  None   Collection Time: 12/03/18  3:36 AM  Result Value Ref Range   HIV Screen 4th Generation wRfx Non Reactive Non Reactive    Comment: (NOTE) Performed At: [facility] [place], [place] [id number] Rush Farmer MD ZH:[id number]   CBC     Status: Abnormal   Collection Time: 12/03/18  3:36 AM  Result Value Ref Range   WBC 17.9 (H) 4.0 - 10.5 K/uL   RBC 3.83 (L) 4.22 - 5.81 MIL/uL   Hemoglobin 10.4 (L) 13.0 - 17.0 g/dL   HCT 31.8 (L) 39.0 - 52.0 %   MCV 83.0 80.0 - 100.0 fL   MCH 27.2 26.0 - 34.0 pg   MCHC 32.7 30.0 - 36.0 g/dL   RDW 14.4 11.5 - 15.5 %   Platelets 627 (H) 150 - 400 K/uL   nRBC 0.0 0.0 - 0.2 %    Comment: Performed at [facility], [place]. [address], [place], [place] [address]  Iron and TIBC     Status: Abnormal   Collection Time: 12/03/18  3:36 AM  Result Value Ref Range   Iron 14 (L) 45 - 182 ug/dL   TIBC 162 (L) 250 - 450 ug/dL   Saturation Ratios 9 (L) 17.9 - 39.5 %   UIBC 148 ug/dL    Comment: Performed at [facility], [place] [address], [place], [place] [address]  Ferritin     Status: Abnormal   Collection Time: 12/03/18  3:36 AM  Result Value Ref Range   Ferritin 1,292 (H) 24 - 336 ng/mL    Comment: Performed at [facility] [address] [place], [place] [address]  Gamma GT     Status: Abnormal   Collection Time: 12/03/18  3:36 AM  Result Value Ref Range   GGT 65 (  H) 7 - 50 U/L    Comment: Performed at [facility], [place] [address], [place], [place] [address]  Basic metabolic panel     Status: Abnormal   Collection Time: 12/03/18  3:36 AM  Result Value Ref Range   Sodium 134 (L) 135 - 145 mmol/L   Potassium 3.1 (L) 3.5 - 5.1 mmol/L   Chloride 100 98 - 111 mmol/L   CO2 24 22 - 32 mmol/L   Glucose, Bld 104 (H) 70 - 99 mg/dL   BUN 6 (L) 8 - 23 mg/dL   Creatinine, Ser 0.83 0.61 - 1.24 mg/dL   Calcium 8.3 (L) 8.9 - 10.3 mg/dL   GFR calc non Af Amer >60 >60 mL/min   GFR calc Af Amer >60 >60 mL/min    Anion gap 10 5 - 15    Comment: Performed at [facility], [place] [address] [place], [place] [address]  Magnesium     Status: None   Collection Time: 12/03/18  3:36 AM  Result Value Ref Range   Magnesium 1.8 1.7 - 2.4 mg/dL    Comment: Performed at [facility] [address], [place], [place] [address]  Osmolality     Status: None   Collection Time: 12/03/18  3:36 AM  Result Value Ref Range   Osmolality 276 275 - 295 mOsm/kg    Comment: Performed at [facility] [address] [place], [place] [address]  Troponin I (High Sensitivity)     Status: None   Collection Time: 12/03/18  3:36 AM  Result Value Ref Range   Troponin I (High Sensitivity) 10 <18 ng/L    Comment: (NOTE) Elevated high sensitivity troponin I (hsTnI) values and significant  changes across serial measurements may suggest ACS but many other  chronic and acute conditions are known to elevate hsTnI results.  Refer to the "Links" section for chest pain algorithms and additional  guidance. Performed at [facility], [place] [address] [place], [place] [address]   Lactate dehydrogenase     Status: None   Collection Time: 12/03/18  3:36 AM  Result Value Ref Range   LDH 118 98 - 192 U/L    Comment: Performed at [facility], [place] [address], [place], [place] [address]  Urinalysis, Routine w reflex microscopic     Status: Abnormal   Collection Time: 12/03/18  3:43 AM  Result Value Ref Range   Color, Urine STRAW (A) YELLOW   APPearance CLEAR CLEAR   Specific Gravity, Urine 1.011 1.005 - 1.030   pH 7.0 5.0 - 8.0   Glucose, UA NEGATIVE NEGATIVE mg/dL   Hgb urine dipstick NEGATIVE NEGATIVE   Bilirubin Urine NEGATIVE NEGATIVE   Ketones, ur NEGATIVE NEGATIVE mg/dL   Protein, ur NEGATIVE NEGATIVE mg/dL   Nitrite NEGATIVE NEGATIVE   Leukocytes,Ua NEGATIVE NEGATIVE    Comment: Performed at [facility] [address], [place], [place] [address]   Ct Abdomen Pelvis Wo Contrast  Result  Date: 12/03/2018 CLINICAL DATA:  Cough and leukocytosis. 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 Abnormal chest CT. EXAM: CT ABDOMEN AND PELVIS WITHOUT CONTRAST TECHNIQUE: Multidetector CT imaging of the abdomen and pelvis was performed following the standard protocol without IV contrast. COMPARISON:  PET-CT 08/22/2010.  Chest CT earlier today. FINDINGS: Lower chest: Loculated air-fluid collection laterally in the right inferior hemithorax is unchanged from the earlier chest CT, measuring up to 11.4 x 5.2  cm transverse on image 5/3 and consistent with empyema. There is underlying centrally cavitary airspace disease in the adjacent right lower lobe as well as compressive atelectasis. Minimal subpleural nodularity is present at the left lung base (image 18/3). No significant left pleural effusion. Hepatobiliary: The liver appears unremarkable as imaged in the noncontrast state. There is high-density material within the gallbladder lumen but no evidence of gallstones, gallbladder wall thickening or biliary dilatation. Pancreas: Unremarkable. No pancreatic ductal dilatation or surrounding inflammatory changes. Spleen: Normal in size without focal abnormality. Adrenals/Urinary Tract: Both adrenal glands appear normal. Retroperitoneal mass involving the right posterior perinephric space is completely imaged on this study, measuring 7.7 x 5.8 cm transverse on image 38/2. This extends 8.3 cm cephalad caudal on image 45/6. This mass abuts and deforms the posterior aspect of the right kidney which is displaced anteriorly. There are internal locules of fat within this lesion which otherwise measures soft tissue and fluid density. The lesion is incompletely characterized on this noncontrast study. The left kidney appears normal. No evidence of urinary tract calculus or hydronephrosis. There is contrast within the urinary bladder from the earlier chest CT. Stomach/Bowel: No evidence of bowel wall thickening,  distention or surrounding inflammatory change. Vascular/Lymphatic: There are no enlarged abdominal or pelvic lymph nodes. There are small retroperitoneal lymph nodes. Aortic and branch vessel atherosclerosis. Reproductive: Mild enlargement of the prostate gland. Other: A small amount of free pelvic fluid is present. There is mild edema throughout the deep and subcutaneous fat consistent with anasarca. No other focal fluid collections are identified. Musculoskeletal: No acute or significant osseous findings. Stable sclerotic lesion medially in the left iliac bone. IMPRESSION: 1. The mass posterior to the right kidney seen on previous chest CT is not further characterized by this noncontrast CT, although is now fully imaged. Based on central low density and appearance on earlier CT, this appears necrotic and could reflect a retroperitoneal abscess based on the chest findings. Necrotic tumor and subacute hematoma less likely. 2. No other significant findings identified within the abdomen or pelvis. 3. Complex air-fluid collection laterally in the right hemithorax again noted consistent with empyema. Airspace opacities at both lung bases. Electronically Signed   By: Richardean Sale M.D.   On: 12/03/2018 10:35   Ct Chest W Contrast  Result Date: 12/03/2018 CLINICAL DATA:  Right-sided chest pain persistent EXAM: CT CHEST WITH CONTRAST TECHNIQUE: Multidetector CT imaging of the chest was performed during intravenous contrast administration. CONTRAST:  25mL OMNIPAQUE IOHEXOL 300 MG/ML  SOLN COMPARISON:  Chest x-ray 12/02/2018, 06/13/2017 FINDINGS: Cardiovascular: Aneurysmal dilatation of the ascending aorta, measuring up to 4 cm, no change. No dissection seen. Mild aortic atherosclerosis. Borderline heart size. No significant pericardial effusion Mediastinum/Nodes: Midline trachea. No thyroid mass. Low right paratracheal lymph node measures 12 mm. Subcarinal lymph node measures 10 mm. Esophagus within normal limits.  Lungs/Pleura: Right posterolateral gas and fluid collection abutting the pleural surface with thick rind and non simple fluid, consistent with empyema. This measures approximately 10 cm oblique AP by 5.3 cm transverse by 14.9 cm cranial caudad. This is contiguous with a smaller loculated gas and fluid collection at the central right lung base. Biapical fibrosis. 9 mm right upper lobe pulmonary nodule, series 5, image number 29. Subpleural ground-glass nodules within the lateral left base, series 5, image number 98 and the posterior left lung base, series 5, image number 101. Upper Abdomen: Subcentimeter hypodensity within the left hepatic lobe, too small to further characterize. Incompletely visualized right retroperitoneal  mass measuring 4.5 cm, appears posterior to the right kidney which appears displaced anteriorly. Musculoskeletal: No acute or suspicious osseous abnormality IMPRESSION: 1. Large loculated gas and fluid collection within the right lateral and posterior thorax abutting the pleural surface, consistent with a large empyema. This measures approximately 10 x 5.3 x 14.9 cm and is contiguous with a smaller loculated fluid collection at the right lung base. 2. Right upper lobe 9 mm pulmonary nodule. Small subpleural ground-glass nodules in the left lower lobe which may be infectious, inflammatory, or possibly metastatic. 3. Mild mediastinal adenopathy likely reactive 4. Incompletely visualized 4.5 cm right retroperitoneal mass posterior to the right kidney. Dedicated abdominopelvic CT suggested for further evaluation. 5. Stable 4 cm ascending thoracic aortic aneurysm. Aortic Atherosclerosis (ICD10-I70.0).Aortic aneurysm NOS (ICD10-I71.9). Electronically Signed   By: Donavan Foil M.D.   On: 12/03/2018 01:54    Pending Labs Unresulted Labs (From admission, onward)    Start     Ordered   12/02/18 2324  Blood culture (routine x 2)  BLOOD CULTURE X 2,   STAT     12/02/18 2325           Vitals/Pain Today's Vitals   12/03/18 1200 12/03/18 1400 12/03/18 1500 12/03/18 1600  BP: 124/81 126/79 127/74 120/74  Pulse: (!) 102 (!) 109 (!) 107 100  Resp: (!) 25 (!) 21 (!) 29 19  Temp:      TempSrc:      SpO2: 93% 94% 94% 92%  Weight:      PainSc:        Isolation Precautions No active isolations  Medications Medications  sodium chloride flush (NS) 0.9 % injection 3 mL (3 mLs Intravenous Not Given 12/03/18 0343)  amLODipine (NORVASC) tablet 10 mg (10 mg Oral Given 12/03/18 1216)  levothyroxine (SYNTHROID) tablet 75 mcg (75 mcg Oral Given 12/03/18 0557)  acetaminophen (TYLENOL) tablet 650 mg (has no administration in time range)    Or  acetaminophen (TYLENOL) suppository 650 mg (has no administration in time range)  0.9 %  sodium chloride infusion ( Intravenous Stopped 12/03/18 1254)  piperacillin-tazobactam (ZOSYN) IVPB 3.375 g (0 g Intravenous Stopped 12/03/18 1212)  vancomycin (VANCOCIN) IVPB 1000 mg/200 mL premix (has no administration in time range)  potassium chloride SA (K-DUR) CR tablet 40 mEq (has no administration in time range)  ferrous sulfate tablet 325 mg (has no administration in time range)  vancomycin (VANCOCIN) IVPB 1000 mg/200 mL premix (0 mg Intravenous Stopped 12/03/18 0224)  ceFEPIme (MAXIPIME) 1 g in sodium chloride 0.9 % 100 mL IVPB (0 g Intravenous Stopped 12/03/18 0101)  sodium chloride 0.9 % bolus 1,000 mL (0 mLs Intravenous Stopped 12/03/18 0333)  iohexol (OMNIPAQUE) 300 MG/ML solution 75 mL (75 mLs Intravenous Contrast Given 12/03/18 0118)  potassium chloride SA (K-DUR) CR tablet 40 mEq (40 mEq Oral Given 12/03/18 0342)    Mobility walks Low fall risk   Focused Assessments Pulmonary Assessment Handoff:  Lung sounds:   O2 Device: Room Air        R Recommendations: See Admitting Provider Note  Report given to:   Additional Notes:

## 2018-12-03 NOTE — Progress Notes (Addendum)
Kevin [name] is a 63 y.o. 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  Patient states he has been coughing for the past 2 days.  He previously had slight discomfort in 1 of his ribs on the right side.  Denies any left-sided or substernal chest pain/pressure.  Denies any injuries to his ribs.  States he went to his PCPs office yesterday and was told his white blood cell count was high on labs.  He was also told that his chest x-ray showed an infection.  He was advised to come into the ED for further evaluation.  Denies any fevers or chills.  Reports feeling slightly short of breath with exertion for the past few days.  Denies any nausea, vomiting, abdominal pain, or diarrhea.  No other complaints.  Chest CT showing a large loculated gas and fluid collection within the right lateral and posterior thorax abutting the pleural surface, consistent with a large empyema measuring approximately 10 x 5.3 x 14.9 cm and is contiguous with a smaller loculated fluid collection at the right lung base. Patient received vancomycin, cefepime, and 1 L IV fluid bolus in the ED.  Interventional radiology consulted for possible ultrasound guided thoracentesis.  12/03/18: Patient was seen and examined at his bedside.  Denies any chest pain at this time.  Admits to persistent cough.  No dyspnea at rest.  Cardiothoracic surgery has been consulted on 12/03/2018 and will see the patient in consultation.  Plan for CT guided drainage catheter tomorrow by interventional radiology with cytology.  N.p.o. after midnight.  MRI abdomen ordered due to concern for possible necrotic retroperitoneal mass.  Please refer to H&P dictated by Dr. Marlowe Sax on 12/03/2018 for further details of the assessment and plan.

## 2018-12-03 NOTE — Consult Note (Signed)
[address]       [place] [address]             [phone]                    Kevin [name] [facility] Medical Record #[id number] Date of Birth: [birth year]  Referring: No ref. provider found Primary Care: Berkley Harvey, NP Primary Cardiologist: No primary care provider on file.  Chief Complaint:    Chief Complaint  Patient presents with   Blood Infection    History of Present Illness:    Kevin [name] 63 y.o. male who presents to the ED with cough, and shortness of breath that began 2-3 days ago.  He was seen by his PCP, who ordered labs and a CXR, which revealed a leukocytosis, and a right sided effusion.  He then presented to the ED, where a CT chest showed a loculated pleural effusion with an air fluid level and a thick rind.  He states that he has felt fine up until the last few days.    Kevin [name] also underwent a CT abdomen, which showed and large right perinephric necrotic mass.     Current Activity/ Functional Status:  Patient is independent with mobility/ambulation, transfers, ADL's, IADL's.   Zubrod Score: At the time of surgery this patients most appropriate activity status/level should be described as: [x]     0    Normal activity, no symptoms []     1    Restricted in physical strenuous activity but ambulatory, able to do out light work []     2    Ambulatory and capable of self care, unable to do work activities, up and about               >50 % of waking hours                              []     3    Only limited self care, in bed greater than 50% of waking hours []     4    Completely disabled, no self care, confined to bed or chair []     5    Moribund   Past Medical History:  Diagnosis Date   Hx of radiation therapy 04/05/10 to 05/25/10   head/neck   Hypertension    Hypothyroid 08/27/2011   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   Pulmonary nodule 04/21/2012    Past Surgical History:  Procedure Laterality  Date   APACO     with tonsillar cancer   APPENDECTOMY     childhood   COLONOSCOPY     NECK LESION BIOPSY  2011    Dx w/ tonsil cancer    WISDOM TOOTH EXTRACTION      Family History  Problem Relation Age of Onset   Stroke Father        dx in his 31s   CAD Neg Hx    Colon cancer Neg Hx    Prostate cancer Neg Hx    Diabetes Neg Hx    Colon polyps Neg Hx    Rectal cancer Neg Hx    Stomach cancer Neg Hx    Esophageal cancer Neg Hx      Social History   Tobacco Use  Smoking Status Never Smoker  Smokeless Tobacco Never Used    Social History   Substance  and Sexual Activity  Alcohol Use Yes   Comment: occassionally     No Known Allergies  Current Facility-Administered Medications  Medication Dose Route Frequency Provider Last Rate Last Dose   0.9 %  sodium chloride infusion  500 mL Intravenous Once Ladene Artist, MD       acetaminophen (TYLENOL) tablet 650 mg  650 mg Oral Q6H PRN Shela Leff, MD       Or   acetaminophen (TYLENOL) suppository 650 mg  650 mg Rectal Q6H PRN Shela Leff, MD       amLODipine (NORVASC) tablet 10 mg  10 mg Oral Daily Shela Leff, MD   10 mg at 12/03/18 1216   levothyroxine (SYNTHROID) tablet 75 mcg  75 mcg Oral QAC breakfast Shela Leff, MD   75 mcg at 12/03/18 0557   piperacillin-tazobactam (ZOSYN) IVPB 3.375 g  3.375 g Intravenous Q8H Einar Grad, [facility]   Stopped at 12/03/18 1212   sodium chloride flush (NS) 0.9 % injection 3 mL  3 mL Intravenous Once Shela Leff, MD       [START ON 12/04/2018] vancomycin (VANCOCIN) IVPB 1000 mg/200 mL premix  1,000 mg Intravenous Q24H Einar Grad, [facility]       Current Outpatient Medications  Medication Sig Dispense Refill   amLODipine (NORVASC) 10 MG tablet Take 10 mg by mouth daily.      levothyroxine (SYNTHROID) 75 MCG tablet Take 75 mcg by mouth daily before breakfast.       Review of Systems:  Constitutional: denies any fevers  or chills, weight has been stable Cardiovascular: denies any chest pain, or lower extremity swelling Pulmonary: + cough, and some shortness of breath GI: negative MSK: right foot pain Neuro: negative   PHYSICAL EXAMINATION: BP 124/81    Pulse (!) 102    Temp 98.2 F (36.8 C) (Oral)    Resp (!) 25    Wt 59 kg    SpO2 93%    BMI 20.98 kg/m  General: non-toxic, appears stated age.   HEENT: NCAT.  Moist mucous membranes, Oropharynx clear.  No cervical or supraclavicular adenopathy. Neuro: Alert, Oriented X 3. Non focal . Psych: appropriate mood. Cardiovascular: sinus tach, no murmur Pulmonary:  Coarse BS on the right base.  Clear in other fields.  Coarse, wet cough GI: NT, ND   Diagnostic Studies & Laboratory data:     Recent Radiology Findings:   Ct Abdomen Pelvis Wo Contrast  Result Date: 12/03/2018 CLINICAL DATA:  Cough and leukocytosis. 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 Abnormal chest CT. EXAM: CT ABDOMEN AND PELVIS WITHOUT CONTRAST TECHNIQUE: Multidetector CT imaging of the abdomen and pelvis was performed following the standard protocol without IV contrast. COMPARISON:  PET-CT 08/22/2010.  Chest CT earlier today. FINDINGS: Lower chest: Loculated air-fluid collection laterally in the right inferior hemithorax is unchanged from the earlier chest CT, measuring up to 11.4 x 5.2 cm transverse on image 5/3 and consistent with empyema. There is underlying centrally cavitary airspace disease in the adjacent right lower lobe as well as compressive atelectasis. Minimal subpleural nodularity is present at the left lung base (image 18/3). No significant left pleural effusion. Hepatobiliary: The liver appears unremarkable as imaged in the noncontrast state. There is high-density material within the gallbladder lumen but no evidence of gallstones, gallbladder wall thickening or biliary dilatation. Pancreas: Unremarkable. No pancreatic ductal dilatation or surrounding inflammatory  changes. Spleen: Normal in size without focal abnormality. Adrenals/Urinary Tract: Both adrenal glands appear  normal. Retroperitoneal mass involving the right posterior perinephric space is completely imaged on this study, measuring 7.7 x 5.8 cm transverse on image 38/2. This extends 8.3 cm cephalad caudal on image 45/6. This mass abuts and deforms the posterior aspect of the right kidney which is displaced anteriorly. There are internal locules of fat within this lesion which otherwise measures soft tissue and fluid density. The lesion is incompletely characterized on this noncontrast study. The left kidney appears normal. No evidence of urinary tract calculus or hydronephrosis. There is contrast within the urinary bladder from the earlier chest CT. Stomach/Bowel: No evidence of bowel wall thickening, distention or surrounding inflammatory change. Vascular/Lymphatic: There are no enlarged abdominal or pelvic lymph nodes. There are small retroperitoneal lymph nodes. Aortic and branch vessel atherosclerosis. Reproductive: Mild enlargement of the prostate gland. Other: A small amount of free pelvic fluid is present. There is mild edema throughout the deep and subcutaneous fat consistent with anasarca. No other focal fluid collections are identified. Musculoskeletal: No acute or significant osseous findings. Stable sclerotic lesion medially in the left iliac bone. IMPRESSION: 1. The mass posterior to the right kidney seen on previous chest CT is not further characterized by this noncontrast CT, although is now fully imaged. Based on central low density and appearance on earlier CT, this appears necrotic and could reflect a retroperitoneal abscess based on the chest findings. Necrotic tumor and subacute hematoma less likely. 2. No other significant findings identified within the abdomen or pelvis. 3. Complex air-fluid collection laterally in the right hemithorax again noted consistent with empyema. Airspace opacities at  both lung bases. Electronically Signed   By: Richardean Sale M.D.   On: 12/03/2018 10:35   Ct Chest W Contrast  Result Date: 12/03/2018 CLINICAL DATA:  Right-sided chest pain persistent EXAM: CT CHEST WITH CONTRAST TECHNIQUE: Multidetector CT imaging of the chest was performed during intravenous contrast administration. CONTRAST:  69mL OMNIPAQUE IOHEXOL 300 MG/ML  SOLN COMPARISON:  Chest x-ray 12/02/2018, 06/13/2017 FINDINGS: Cardiovascular: Aneurysmal dilatation of the ascending aorta, measuring up to 4 cm, no change. No dissection seen. Mild aortic atherosclerosis. Borderline heart size. No significant pericardial effusion Mediastinum/Nodes: Midline trachea. No thyroid mass. Low right paratracheal lymph node measures 12 mm. Subcarinal lymph node measures 10 mm. Esophagus within normal limits. Lungs/Pleura: Right posterolateral gas and fluid collection abutting the pleural surface with thick rind and non simple fluid, consistent with empyema. This measures approximately 10 cm oblique AP by 5.3 cm transverse by 14.9 cm cranial caudad. This is contiguous with a smaller loculated gas and fluid collection at the central right lung base. Biapical fibrosis. 9 mm right upper lobe pulmonary nodule, series 5, image number 29. Subpleural ground-glass nodules within the lateral left base, series 5, image number 98 and the posterior left lung base, series 5, image number 101. Upper Abdomen: Subcentimeter hypodensity within the left hepatic lobe, too small to further characterize. Incompletely visualized right retroperitoneal mass measuring 4.5 cm, appears posterior to the right kidney which appears displaced anteriorly. Musculoskeletal: No acute or suspicious osseous abnormality IMPRESSION: 1. Large loculated gas and fluid collection within the right lateral and posterior thorax abutting the pleural surface, consistent with a large empyema. This measures approximately 10 x 5.3 x 14.9 cm and is contiguous with a smaller  loculated fluid collection at the right lung base. 2. Right upper lobe 9 mm pulmonary nodule. Small subpleural ground-glass nodules in the left lower lobe which may be infectious, inflammatory, or possibly metastatic. 3. Mild mediastinal adenopathy  likely reactive 4. Incompletely visualized 4.5 cm right retroperitoneal mass posterior to the right kidney. Dedicated abdominopelvic CT suggested for further evaluation. 5. Stable 4 cm ascending thoracic aortic aneurysm. Aortic Atherosclerosis (ICD10-I70.0).Aortic aneurysm NOS (ICD10-I71.9). Electronically Signed   By: Donavan Foil M.D.   On: 12/03/2018 01:54       I have independently reviewed the above radiology studies  and reviewed the findings with the patient.   Recent Lab Findings: Lab Results  Component Value Date   WBC 17.9 (H) 12/03/2018   HGB 10.4 (L) 12/03/2018   HCT 31.8 (L) 12/03/2018   PLT 627 (H) 12/03/2018   GLUCOSE 104 (H) 12/03/2018   CHOL 181 04/29/2014   TRIG 51.0 04/29/2014   HDL 66.90 04/29/2014   LDLCALC 104 (H) 04/29/2014   ALT 17 12/02/2018   AST 21 12/02/2018   NA 134 (L) 12/03/2018   K 3.1 (L) 12/03/2018   CL 100 12/03/2018   CREATININE 0.83 12/03/2018   BUN 6 (L) 12/03/2018   CO2 24 12/03/2018   TSH 3.68 04/29/2014   INR 0.97 12/27/2009       Assessment / Plan:   63 yo male with hx of head and neck squamous cell cancer, now presents with a loculated right sided pleural effusion concerning for empyema, and a right perinephric necrotic mass.  Given the location, it is possible that these 2 processes are related, but the etiology remains unclear.  Although he has a leukocytosis, given the appearance of the pleural effusion, I am surprised that he is not more ill from this.  Additionally, given that he has a thick pleural, it is likely that this has been a chronic process.  The other concern is that he may have a necrotic tumor in his retroperitoneum that has necessitated up into his chest.  This could  potentially explain the chronicity, and lack of remote symptoms.  Recs: - CT guided drain placement with fluid sent for cytology - MRI abdomen for further evaluation of the retroperitoneal process - if above shows infective etiology, he will be taken for a right VATS, decortication.      I  spent 40 minutes with  the patient face to face and greater then 50% of the time was spent in counseling and coordination of care.    Lajuana Matte 12/03/2018 2:53 PM

## 2018-12-03 NOTE — Progress Notes (Signed)
Pharmacy Antibiotic Note  Kevin [name] is a 63 y.o. male admitted on 12/02/2018 with empyema. Pharmacy has been consulted for vancomycin and Zosyn dosing. WBC elevated, Cr ~0.9, first ABX doses given per EDP.  Plan: Zosyn 3.375g IV EI q8h Vancomycin 1000mg  q24h Monitor renal function, LOT, cultures Vancomycin levels as needed   Weight: 130 lb (59 kg)  Temp (24hrs), Avg:98.2 F (36.8 C), Min:98.2 F (36.8 C), Max:98.2 F (36.8 C)  Recent Labs  Lab 12/02/18 1645 12/02/18 1800  WBC 18.9*  --   CREATININE  --  0.91  LATICACIDVEN 1.0  --     Estimated Creatinine Clearance: 71.1 mL/min (by C-G formula based on SCr of 0.91 mg/dL).    No Known Allergies  Antimicrobials this admission: Vancomycin 7/29 >>  Zosyn 7/29 >>  Cefepime 7/29 x1  Microbiology results: pending  Thank you for allowing pharmacy to be a part of this patient's care.   Arrie Senate, PharmD, BCPS Clinical Pharmacist Please check AMION for all [place] numbers 12/03/2018

## 2018-12-03 NOTE — ED Notes (Signed)
Patient transported to CT by this RN 

## 2018-12-04 ENCOUNTER — Inpatient Hospital Stay (HOSPITAL_COMMUNITY): Payer: BC Managed Care – PPO

## 2018-12-04 LAB — PROTIME-INR
INR: 1.3 — ABNORMAL HIGH (ref 0.8–1.2)
Prothrombin Time: 15.8 seconds — ABNORMAL HIGH (ref 11.4–15.2)

## 2018-12-04 MED ORDER — FENTANYL CITRATE (PF) 100 MCG/2ML IJ SOLN
INTRAMUSCULAR | Status: AC | PRN
  Administered 2018-12-04: 50 ug via INTRAVENOUS

## 2018-12-04 MED ORDER — LIDOCAINE HCL 1 % IJ SOLN
INTRAMUSCULAR | Status: AC
  Filled 2018-12-04: qty 20

## 2018-12-04 MED ORDER — FENTANYL CITRATE (PF) 100 MCG/2ML IJ SOLN
INTRAMUSCULAR | Status: AC
  Filled 2018-12-04: qty 2

## 2018-12-04 MED ORDER — MIDAZOLAM HCL 2 MG/2ML IJ SOLN
INTRAMUSCULAR | Status: AC | PRN
  Administered 2018-12-04: 1 mg via INTRAVENOUS

## 2018-12-04 MED ORDER — MIDAZOLAM HCL 2 MG/2ML IJ SOLN
INTRAMUSCULAR | Status: AC
  Filled 2018-12-04: qty 2

## 2018-12-04 MED ORDER — VANCOMYCIN HCL IN DEXTROSE 750-5 MG/150ML-% IV SOLN
750.0000 mg | Freq: Two times a day (BID) | INTRAVENOUS | Status: DC
  Administered 2018-12-04 – 2018-12-07 (×6): 750 mg via INTRAVENOUS
  Filled 2018-12-04 (×6): qty 150

## 2018-12-04 NOTE — Progress Notes (Signed)
     [address]       [place] [address]             [phone]       MRI shows mass likely consistent with perinephric abscess. Scheduled for CT guided drain of the right pleural fluid collection, but will also need source control of the retroperitoneal abscess.    Will plan for R VATS once both collections have been drained.

## 2018-12-04 NOTE — Procedures (Signed)
Interventional Radiology Procedure Note  Procedure: Placement of a 59F Thall drain into the loculated right hydropneumothorax.  Small volume aspirated fluid sent for cytology as requested.   Complications: None  Estimated Blood Loss: None  Recommendations: - Tube to wall suction  Signed,  Criselda Peaches, MD

## 2018-12-04 NOTE — Progress Notes (Signed)
Pharmacy Antibiotic Note  Kevin [name] is a 63 y.o. male admitted on 12/02/2018 with empyema. Pharmacy was consulted on 12/03/18 for vancomycin and Zosyn dosing. WBC elevated, SCr 0.83.  Current dose vancomycin 1000 mg IV q24h,  Estimated AUC = 327  Will increase vancomycin dose for goal AUC 400-550   Plan: Continue  Zosyn 3.375g IV EI q8h Increase Vancomycin dose to 750 mg q12h Goal AUC 400-550. Expected AUC:491 SCr used: 0.83 Monitor renal function, LOT, cultures Vancomycin levels as needed   Height: 5\' 6"  (167.6 cm) Weight: 132 lb 11.5 oz (60.2 kg) IBW/kg (Calculated) : 63.8  Temp (24hrs), Avg:99.1 F (37.3 C), Min:98.1 F (36.7 C), Max:100.7 F (38.2 C)  Recent Labs  Lab 12/02/18 1645 12/02/18 1800 12/03/18 0336  WBC 18.9*  --  17.9*  CREATININE  --  0.91 0.83  LATICACIDVEN 1.0  --   --     Estimated Creatinine Clearance: 79.6 mL/min (by C-G formula based on SCr of 0.83 mg/dL).    No Known Allergies  Antimicrobials this admission: Vancomycin 7/29 >>  Zosyn 7/29 >>  Cefepime 7/29 x1  Microbiology results: 7/28 bcx - ngtd 7/28 covid - neg    Thank you for allowing pharmacy to be a part of this patient's care. Nicole Cella, RPh Clinical Pharmacist [phone] Please check AMION for all [name] phone numbers After 10:00 PM, call [name] [phone] 12/04/2018 3:41 PM

## 2018-12-04 NOTE — Progress Notes (Signed)
PROGRESS NOTE  Kevin [name] ZJI:[id number] DOB: 1955-10-10 DOA: 12/02/2018 PCP: Berkley Harvey, NP  HPI/Recap of past 24 hours: Kevin [name] a 63 y.o.malewith medical history significant ofhead and neck cancer status post radiation, hypertension, hypothyroidism, pulmonary nodule presenting to the hospital for evaluation of cough, abnormal labs, and abnormal chest x-ray. Patient states he has been coughing for the past 2 days. He previously had slight discomfort in 1 of his ribs on the right side. Denies any left-sided or substernal chest pain/pressure. Denies any injuries to his ribs. States he went to his PCPs office yesterday and was told his white blood cell count was high on labs. He was also told that his chest x-ray showed an infection. He was advised to come into the ED for further evaluation. Denies any fevers or chills. Reports feeling slightly short of breath with exertion for the past few days. Denies any nausea, vomiting, abdominal pain, or diarrhea. No other complaints.  Chest CT showing a large loculated gas and fluid collection within the right lateral and posterior thorax abutting the pleural surface, consistent with a large empyema measuring approximately 10 x 5.3 x 14.9 cm and is contiguous with a smaller loculated fluid collection at the right lung base. Patient received vancomycin, cefepime, and 1 L IV fluid bolus in the ED.  Interventional radiology consulted for possible ultrasound guided thoracentesis.  12/04/18: He has no new complaints this am. States cough is better. denies chest pain or dyspnea this am. Post chest tube placement by IR.   Assessment/Plan: Principal Problem:   Empyema (HCC) Active Problems:   Hypothyroid   Pulmonary nodule   Anemia   Hyponatremia  Large R empyema CT guided drain placement on 12/04/18 by IR C/w IV zosyn and IV vancomycin Maintain O2 sat> 92% Obtain continuous ulseoxymetry Fluid for cytology  Perinephric  abscess MRI consistent w finding on CT abd pelvis IR and CTS following C/w zosyn empirically C/w IV van as rec by CTS, monitor renal function Monitor fever curve and wbc Repeat cbc in the am  Hx of oropharyngeal cancer Follow up outpatient with hemoncology  Hypokalemia repleted Repeat labs, BMP in the am  Improving hypovolemic hyponatremia Na+ 134 from 129 on presentation  Risks: High risk for decompensation due to R empyema, perinephric abscess, multiple comorbidities and advanced age. Patient will require at least 2 midnights for further evaluation and treatment.   DVT prophylaxis: SCDs at this time in anticipation of procedure. Code Status: Patient wishes to be full code. Family Communication: No family available at this time. Disposition Plan: Anticipate discharge after clinical improvement. Consults called: IR   Objective: Vitals:   12/04/18 1245 12/04/18 1250 12/04/18 1305 12/04/18 1330  BP: 102/71 101/70 122/78   Pulse: 85 87 89   Resp:   18   Temp:    98.2 F (36.8 C)  TempSrc:    Oral  SpO2: 93% 96% 94%   Weight:      Height:        Intake/Output Summary (Last 24 hours) at 12/04/2018 1603 Last data filed at 12/04/2018 1500 Gross per 24 hour  Intake 510 ml  Output --  Net 510 ml   Filed Weights   12/03/18 0215 12/03/18 1659  Weight: 59 kg 60.2 kg    Exam:   General: 63 y.o. year-old male well developed well nourished in no acute distress.  Alert and oriented x3.  Cardiovascular: Regular rate and rhythm with no rubs or gallops.  No  thyromegaly or JVD noted.    Respiratory: No wheezes. Mild rales at bases. Poor inspiratory effort.  Abdomen: Soft nontender nondistended with normal bowel sounds x4 quadrants.  Musculoskeletal: Trace lower extremity edema. 2/4 pulses in all 4 extremities.  Psychiatry: Mood is appropriate for condition and setting   Data Reviewed: CBC: Recent Labs  Lab 12/02/18 1645 12/03/18 0336  WBC 18.9* 17.9*  NEUTROABS  16.3*  --   HGB 9.4* 10.4*  HCT 28.3* 31.8*  MCV 81.6 83.0  PLT 601* 952*   Basic Metabolic Panel: Recent Labs  Lab 12/02/18 1800 12/03/18 0336  NA 129* 134*  K 3.1* 3.1*  CL 97* 100  CO2 21* 24  GLUCOSE 136* 104*  BUN 10 6*  CREATININE 0.91 0.83  CALCIUM 8.1* 8.3*  MG  --  1.8   GFR: Estimated Creatinine Clearance: 79.6 mL/min (by C-G formula based on SCr of 0.83 mg/dL). Liver Function Tests: Recent Labs  Lab 12/02/18 1800  AST 21  ALT 17  ALKPHOS 136*  BILITOT 0.3  PROT 6.3*  ALBUMIN 2.0*   No results for input(s): LIPASE, AMYLASE in the last 168 hours. No results for input(s): AMMONIA in the last 168 hours. Coagulation Profile: Recent Labs  Lab 12/04/18 0316  INR 1.3*   Cardiac Enzymes: No results for input(s): CKTOTAL, CKMB, CKMBINDEX, TROPONINI in the last 168 hours. BNP (last 3 results) No results for input(s): PROBNP in the last 8760 hours. HbA1C: No results for input(s): HGBA1C in the last 72 hours. CBG: No results for input(s): GLUCAP in the last 168 hours. Lipid Profile: No results for input(s): CHOL, HDL, LDLCALC, TRIG, CHOLHDL, LDLDIRECT in the last 72 hours. Thyroid Function Tests: No results for input(s): TSH, T4TOTAL, FREET4, T3FREE, THYROIDAB in the last 72 hours. Anemia Panel: Recent Labs    12/03/18 0336  FERRITIN 1,292*  TIBC 162*  IRON 14*   Urine analysis:    Component Value Date/Time   COLORURINE STRAW (A) 12/03/2018 0343   APPEARANCEUR CLEAR 12/03/2018 0343   LABSPEC 1.011 12/03/2018 0343   PHURINE 7.0 12/03/2018 0343   GLUCOSEU NEGATIVE 12/03/2018 0343   HGBUR NEGATIVE 12/03/2018 0343   BILIRUBINUR NEGATIVE 12/03/2018 0343   KETONESUR NEGATIVE 12/03/2018 0343   PROTEINUR NEGATIVE 12/03/2018 0343   NITRITE NEGATIVE 12/03/2018 0343   LEUKOCYTESUR NEGATIVE 12/03/2018 0343   Sepsis Labs: @LABRCNTIP (procalcitonin:4,lacticidven:4)  ) Recent Results (from the past 240 hour(s))  Blood culture (routine x 2)     Status:  None (Preliminary result)   Collection Time: 12/02/18 11:29 PM   Specimen: BLOOD  Result Value Ref Range Status   Specimen Description BLOOD RIGHT ANTECUBITAL  Final   Special Requests   Final    BOTTLES DRAWN AEROBIC AND ANAEROBIC Blood Culture results may not be optimal due to an excessive volume of blood received in culture bottles   Culture   Final    NO GROWTH 1 DAY Performed at [facility] [address] [place], [place] [address]    Report Status PENDING  Incomplete  Blood culture (routine x 2)     Status: None (Preliminary result)   Collection Time: 12/02/18 11:38 PM   Specimen: BLOOD  Result Value Ref Range Status   Specimen Description BLOOD LEFT ANTECUBITAL  Final   Special Requests   Final    BOTTLES DRAWN AEROBIC AND ANAEROBIC Blood Culture results may not be optimal due to an excessive volume of blood received in culture bottles   Culture  Final    NO GROWTH 1 DAY Performed at [facility], [place] [address] [place], [place] [address]    Report Status PENDING  Incomplete  SARS Coronavirus 2 (CEPHEID- Performed in [facility] hospital lab), Hosp Order     Status: None   Collection Time: 12/03/18  1:04 AM   Specimen: Nasopharyngeal Swab  Result Value Ref Range Status   SARS Coronavirus 2 NEGATIVE NEGATIVE Final    Comment: (NOTE) If result is NEGATIVE SARS-CoV-2 target nucleic acids are NOT DETECTED. The SARS-CoV-2 RNA is generally detectable in upper and lower  respiratory specimens during the acute phase of infection. The lowest  concentration of SARS-CoV-2 viral copies this assay can detect is 250  copies / mL. A negative result does not preclude SARS-CoV-2 infection  and should not be used as the sole basis for treatment or other  patient management decisions.  A negative result may occur with  improper specimen collection / handling, submission of specimen other  than nasopharyngeal swab, presence of viral mutation(s) within the  areas targeted  by this assay, and inadequate number of viral copies  (<250 copies / mL). A negative result must be combined with clinical  observations, patient history, and epidemiological information. If result is POSITIVE SARS-CoV-2 target nucleic acids are DETECTED. The SARS-CoV-2 RNA is generally detectable in upper and lower  respiratory specimens dur ing the acute phase of infection.  Positive  results are indicative of active infection with SARS-CoV-2.  Clinical  correlation with patient history and other diagnostic information is  necessary to determine patient infection status.  Positive results do  not rule out bacterial infection or co-infection with other viruses. If result is PRESUMPTIVE POSTIVE SARS-CoV-2 nucleic acids MAY BE PRESENT.   A presumptive positive result was obtained on the submitted specimen  and confirmed on repeat testing.  While 2019 novel coronavirus  (SARS-CoV-2) nucleic acids may be present in the submitted sample  additional confirmatory testing may be necessary for epidemiological  and / or clinical management purposes  to differentiate between  SARS-CoV-2 and other Sarbecovirus currently known to infect humans.  If clinically indicated additional testing with an alternate test  methodology [phone]) is advised. The SARS-CoV-2 RNA is generally  detectable in upper and lower respiratory sp ecimens during the acute  phase of infection. The expected result is Negative. Fact Sheet for Patients:  StrictlyIdeas.no Fact Sheet for Healthcare Providers: BankingDealers.co.za This test is not yet approved or cleared by the [place] FDA and has been authorized for detection and/or diagnosis of SARS-CoV-2 by FDA under an Emergency Use Authorization (EUA).  This EUA will remain in effect (meaning this test can be used) for the duration of the COVID-19 declaration under Section 564(b)(1) of the Act, 21 U.S.C. section  360bbb-3(b)(1), unless the authorization is terminated or revoked sooner. Performed at [facility], [place] [address], [place], [place] [address]       Studies: Mr Abdomen W Wo Contrast  Result Date: 12/04/2018 CLINICAL DATA:  Right renal mass EXAM: MRI ABDOMEN WITHOUT AND WITH CONTRAST TECHNIQUE: Multiplanar multisequence MR imaging of the abdomen was performed both before and after the administration of intravenous contrast. CONTRAST:  6 mL Gadovist IV COMPARISON:  CT abdomen/pelvis dated 12/03/2018. CT chest dated 12/03/2018. FINDINGS: Lower chest: Pleural-based fluid/gas collection along the lateral right hemithorax, compatible with empyema, incompletely visualized. Hepatobiliary: Liver is within normal limits. Status post cholecystectomy. No intrahepatic or extrahepatic ductal dilatation. Pancreas:  Within normal limits.  Spleen:  Within normal limits. Adrenals/Urinary Tract:  Adrenal glands are within normal limits. Complex cystic lesion with multiple enhancing septations and thick enhancing rim along the posterior right pararenal space, measuring 6.1 x 7.9 x 8.8 cm, corresponding to the CT abnormality. Associated restricted diffusion. No intrinsic hyperintensity on precontrast T1 imaging to suggest hemorrhage. No solid enhancing component. Lesion appears to arise from the posterior left lower kidney (series 15/image 61), favoring a perinephric abscess. 11 mm cyst in the medial left upper kidney (series 15/image 5). No hydronephrosis. Stomach/Bowel: Stomach is within normal limits. Visualized bowel is unremarkable. Vascular/Lymphatic:  No evidence of abdominal aortic aneurysm. No suspicious abdominal lymphadenopathy. Other:  No abdominal ascites. Musculoskeletal: No focal osseous lesions. IMPRESSION: 8.8 cm abscess in the posterior right pararenal space, arising from the posterior right lower kidney, corresponding to the CT abnormality. Right pleural empyema, incompletely visualized, better  evaluated on CT chest. Electronically Signed   By: Julian Hy M.D.   On: 12/04/2018 00:31    Scheduled Meds:  amLODipine  10 mg Oral Daily   ferrous sulfate  325 mg Oral BID WC   levothyroxine  75 mcg Oral QAC breakfast   lidocaine       sodium chloride flush  3 mL Intravenous Once    Continuous Infusions:  piperacillin-tazobactam (ZOSYN)  IV 3.375 g (12/04/18 1532)   vancomycin       LOS: 1 day     Kayleen Memos, MD [facility] Pager [phone]  If 7PM-7AM, please contact night-coverage [email or web address] Password [email or web address] 12/04/2018, 4:03 PM

## 2018-12-05 LAB — COMPREHENSIVE METABOLIC PANEL
ALT: 18 U/L (ref 0–44)
AST: 16 U/L (ref 15–41)
Albumin: 1.9 g/dL — ABNORMAL LOW (ref 3.5–5.0)
Alkaline Phosphatase: 122 U/L (ref 38–126)
Anion gap: 8 (ref 5–15)
BUN: 10 mg/dL (ref 8–23)
CO2: 25 mmol/L (ref 22–32)
Calcium: 8.5 mg/dL — ABNORMAL LOW (ref 8.9–10.3)
Chloride: 102 mmol/L (ref 98–111)
Creatinine, Ser: 0.95 mg/dL (ref 0.61–1.24)
GFR calc Af Amer: >60 mL/min (ref >60)
GFR calc non Af Amer: >60 mL/min (ref >60)
Glucose, Bld: 105 mg/dL — ABNORMAL HIGH (ref 70–99)
Potassium: 4 mmol/L (ref 3.5–5.1)
Sodium: 135 mmol/L (ref 135–145)
Total Bilirubin: 0.5 mg/dL (ref 0.3–1.2)
Total Protein: 6.3 g/dL — ABNORMAL LOW (ref 6.5–8.1)

## 2018-12-05 LAB — CBC WITH DIFFERENTIAL/PLATELET
Abs Immature Granulocytes: 0.2 10*3/uL — ABNORMAL HIGH (ref 0.00–0.07)
Basophils Absolute: 0.1 10*3/uL (ref 0.0–0.1)
Basophils Relative: 0 %
Eosinophils Absolute: 0.5 10*3/uL (ref 0.0–0.5)
Eosinophils Relative: 3 %
HCT: 31 % — ABNORMAL LOW (ref 39.0–52.0)
Hemoglobin: 10.3 g/dL — ABNORMAL LOW (ref 13.0–17.0)
Immature Granulocytes: 1 %
Lymphocytes Relative: 5 %
Lymphs Abs: 0.8 10*3/uL (ref 0.7–4.0)
MCH: 27 pg (ref 26.0–34.0)
MCHC: 33.2 g/dL (ref 30.0–36.0)
MCV: 81.4 fL (ref 80.0–100.0)
Monocytes Absolute: 0.9 10*3/uL (ref 0.1–1.0)
Monocytes Relative: 5 %
Neutro Abs: 14.7 10*3/uL — ABNORMAL HIGH (ref 1.7–7.7)
Neutrophils Relative %: 86 %
Platelets: 602 10*3/uL — ABNORMAL HIGH (ref 150–400)
RBC: 3.81 MIL/uL — ABNORMAL LOW (ref 4.22–5.81)
RDW: 14.5 % (ref 11.5–15.5)
WBC: 17.1 10*3/uL — ABNORMAL HIGH (ref 4.0–10.5)
nRBC: 0 % (ref 0.0–0.2)

## 2018-12-05 LAB — MAGNESIUM: Magnesium: 2.1 mg/dL (ref 1.7–2.4)

## 2018-12-05 LAB — PHOSPHORUS: Phosphorus: 3.5 mg/dL (ref 2.5–4.6)

## 2018-12-05 MED ORDER — ADULT MULTIVITAMIN W/MINERALS CH
1.0000 | ORAL_TABLET | Freq: Every day | ORAL | Status: DC
  Administered 2018-12-05 – 2018-12-13 (×8): 1 via ORAL
  Filled 2018-12-05 (×8): qty 1

## 2018-12-05 MED ORDER — ENOXAPARIN SODIUM 40 MG/0.4ML ~~LOC~~ SOLN
40.0000 mg | SUBCUTANEOUS | Status: DC
  Administered 2018-12-07 – 2018-12-09 (×3): 40 mg via SUBCUTANEOUS
  Filled 2018-12-05 (×4): qty 0.4

## 2018-12-05 MED ORDER — ENOXAPARIN SODIUM 40 MG/0.4ML ~~LOC~~ SOLN
40.0000 mg | SUBCUTANEOUS | Status: DC
  Administered 2018-12-05: 12:00:00 40 mg via SUBCUTANEOUS
  Filled 2018-12-05: qty 0.4

## 2018-12-05 NOTE — Progress Notes (Signed)
     [address]       [place], [address]             [phone]       No events.  Frustrated this am Underwent CT guided drain yesterday with return of thick bloody fluid  Vitals:   12/05/18 0628 12/05/18 0907  BP: 112/70 117/75  Pulse: 69 77  Resp:  15  Temp: 98 F (36.7 C) 98.4 F (36.9 C)  SpO2: 95% 96%   Alert NAD EWOB SS CT output  CBC Latest Ref Rng & Units 12/05/2018 12/03/2018 12/02/2018  WBC 4.0 - 10.5 K/uL 17.1(H) 17.9(H) 18.9(H)  Hemoglobin 13.0 - 17.0 g/dL 10.3(L) 10.4(L) 9.4(L)  Hematocrit 39.0 - 52.0 % 31.0(L) 31.8(L) 28.3(L)  Platelets 150 - 400 K/uL 602(H) 627(H) 601(H)   BMP Latest Ref Rng & Units 12/05/2018 12/03/2018 12/02/2018  Glucose 70 - 99 mg/dL 105(H) 104(H) 136(H)  BUN 8 - 23 mg/dL 10 6(L) 10  Creatinine 0.61 - 1.24 mg/dL 0.95 0.83 0.91  Sodium 135 - 145 mmol/L 135 134(L) 129(L)  Potassium 3.5 - 5.1 mmol/L 4.0 3.1(L) 3.1(L)  Chloride 98 - 111 mmol/L 102 100 97(L)  CO2 22 - 32 mmol/L 25 24 21(L)  Calcium 8.9 - 10.3 mg/dL 8.5(L) 8.3(L) 8.1(L)    63 yo male with right empyema, and right perinephric abscess.  The two processes are likely related. - pt will need drainage of the perinephric abscess prior to VATS decortication.  - will continue to follow

## 2018-12-05 NOTE — Progress Notes (Signed)
Referring Physician(s): Dr. Nevada Crane  Supervising Physician: Arne Cleveland  Patient Status:  [facility] - In-pt  Chief Complaint: Follow up right chest tube placed 7/30 by Dr. Laurence Ferrari & consult for right perinephritic abscess aspiration/drain placement.  Subjective:  Patient very frustrated that he was given Lovenox this afternoon which prevented him from undergoing abscess drain placement today, states he is very upset with lack of communication between departments and that he has not received a call from his doctor yet. He is requesting to speak to a patient liaison or "someone who will actually listen to me because this is ridiculous." He is also upset that he was unable to eat or drink for much of the time he was in the ED and then on the floor, he states that his mouth gets very dry because of his cancer and this was very frustrating to him. Much of our conversation today was spent discussing his frustrations with his care however he does state that he feels the floor staff has been providing good care and that the food has been good.  After some redirection he tells me that his chest tube is somewhat sore but mostly only when he moves around. He is hopeful that he can undergo VATS this weekend after abscess drain is placed. His only complaint is that he is hungry, he denies any pain anywhere. He states understanding of the requested procedure and would like to proceed.  Allergies: Patient has no known allergies.  Medications: Prior to Admission medications   Medication Sig Start Date End Date Taking? Authorizing Provider  amLODipine (NORVASC) 10 MG tablet Take 10 mg by mouth daily.  05/22/16  Yes [provider]  levothyroxine (SYNTHROID) 75 MCG tablet Take 75 mcg by mouth daily before breakfast.   Yes [provider]     Vital Signs: BP 117/75 (BP Location: Left Arm)    Pulse 77    Temp 98.4 F (36.9 C) (Oral)    Resp 15    Ht 5\' 6"  (1.676 m)    Wt 132 lb 11.5 oz  (60.2 kg)    SpO2 96%    BMI 21.42 kg/m   Physical Exam Vitals signs and nursing note reviewed.  Constitutional:      General: He is not in acute distress. HENT:     Head: Normocephalic.  Cardiovascular:     Rate and Rhythm: Normal rate.  Pulmonary:     Effort: Pulmonary effort is normal.     Breath sounds: Normal breath sounds.     Comments: (+) right chest tube, not to suction, serosanguineous output in Pleurvac. Insertion site clean, dry, dressed appropriately.  Skin:    General: Skin is warm and dry.  Neurological:     Mental Status: He is alert. Mental status is at baseline.     Imaging: Ct Abdomen Pelvis Wo Contrast  Result Date: 12/03/2018 CLINICAL DATA:  Cough and leukocytosis. 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 Abnormal chest CT. EXAM: CT ABDOMEN AND PELVIS WITHOUT CONTRAST TECHNIQUE: Multidetector CT imaging of the abdomen and pelvis was performed following the standard protocol without IV contrast. COMPARISON:  PET-CT 08/22/2010.  Chest CT earlier today. FINDINGS: Lower chest: Loculated air-fluid collection laterally in the right inferior hemithorax is unchanged from the earlier chest CT, measuring up to 11.4 x 5.2 cm transverse on image 5/3 and consistent with empyema. There is underlying centrally cavitary airspace disease in the adjacent right lower lobe as well as compressive atelectasis.  Minimal subpleural nodularity is present at the left lung base (image 18/3). No significant left pleural effusion. Hepatobiliary: The liver appears unremarkable as imaged in the noncontrast state. There is high-density material within the gallbladder lumen but no evidence of gallstones, gallbladder wall thickening or biliary dilatation. Pancreas: Unremarkable. No pancreatic ductal dilatation or surrounding inflammatory changes. Spleen: Normal in size without focal abnormality. Adrenals/Urinary Tract: Both adrenal glands appear normal. Retroperitoneal mass involving the  right posterior perinephric space is completely imaged on this study, measuring 7.7 x 5.8 cm transverse on image 38/2. This extends 8.3 cm cephalad caudal on image 45/6. This mass abuts and deforms the posterior aspect of the right kidney which is displaced anteriorly. There are internal locules of fat within this lesion which otherwise measures soft tissue and fluid density. The lesion is incompletely characterized on this noncontrast study. The left kidney appears normal. No evidence of urinary tract calculus or hydronephrosis. There is contrast within the urinary bladder from the earlier chest CT. Stomach/Bowel: No evidence of bowel wall thickening, distention or surrounding inflammatory change. Vascular/Lymphatic: There are no enlarged abdominal or pelvic lymph nodes. There are small retroperitoneal lymph nodes. Aortic and branch vessel atherosclerosis. Reproductive: Mild enlargement of the prostate gland. Other: A small amount of free pelvic fluid is present. There is mild edema throughout the deep and subcutaneous fat consistent with anasarca. No other focal fluid collections are identified. Musculoskeletal: No acute or significant osseous findings. Stable sclerotic lesion medially in the left iliac bone. IMPRESSION: 1. The mass posterior to the right kidney seen on previous chest CT is not further characterized by this noncontrast CT, although is now fully imaged. Based on central low density and appearance on earlier CT, this appears necrotic and could reflect a retroperitoneal abscess based on the chest findings. Necrotic tumor and subacute hematoma less likely. 2. No other significant findings identified within the abdomen or pelvis. 3. Complex air-fluid collection laterally in the right hemithorax again noted consistent with empyema. Airspace opacities at both lung bases. Electronically Signed   By: Richardean Sale M.D.   On: 12/03/2018 10:35   Ct Chest W Contrast  Result Date: 12/03/2018 CLINICAL  DATA:  Right-sided chest pain persistent EXAM: CT CHEST WITH CONTRAST TECHNIQUE: Multidetector CT imaging of the chest was performed during intravenous contrast administration. CONTRAST:  42mL OMNIPAQUE IOHEXOL 300 MG/ML  SOLN COMPARISON:  Chest x-ray 12/02/2018, 06/13/2017 FINDINGS: Cardiovascular: Aneurysmal dilatation of the ascending aorta, measuring up to 4 cm, no change. No dissection seen. Mild aortic atherosclerosis. Borderline heart size. No significant pericardial effusion Mediastinum/Nodes: Midline trachea. No thyroid mass. Low right paratracheal lymph node measures 12 mm. Subcarinal lymph node measures 10 mm. Esophagus within normal limits. Lungs/Pleura: Right posterolateral gas and fluid collection abutting the pleural surface with thick rind and non simple fluid, consistent with empyema. This measures approximately 10 cm oblique AP by 5.3 cm transverse by 14.9 cm cranial caudad. This is contiguous with a smaller loculated gas and fluid collection at the central right lung base. Biapical fibrosis. 9 mm right upper lobe pulmonary nodule, series 5, image number 29. Subpleural ground-glass nodules within the lateral left base, series 5, image number 98 and the posterior left lung base, series 5, image number 101. Upper Abdomen: Subcentimeter hypodensity within the left hepatic lobe, too small to further characterize. Incompletely visualized right retroperitoneal mass measuring 4.5 cm, appears posterior to the right kidney which appears displaced anteriorly. Musculoskeletal: No acute or suspicious osseous abnormality IMPRESSION: 1. Large loculated gas and  fluid collection within the right lateral and posterior thorax abutting the pleural surface, consistent with a large empyema. This measures approximately 10 x 5.3 x 14.9 cm and is contiguous with a smaller loculated fluid collection at the right lung base. 2. Right upper lobe 9 mm pulmonary nodule. Small subpleural ground-glass nodules in the left lower  lobe which may be infectious, inflammatory, or possibly metastatic. 3. Mild mediastinal adenopathy likely reactive 4. Incompletely visualized 4.5 cm right retroperitoneal mass posterior to the right kidney. Dedicated abdominopelvic CT suggested for further evaluation. 5. Stable 4 cm ascending thoracic aortic aneurysm. Aortic Atherosclerosis (ICD10-I70.0).Aortic aneurysm NOS (ICD10-I71.9). Electronically Signed   By: Donavan Foil M.D.   On: 12/03/2018 01:54   Mr Abdomen W Wo Contrast  Result Date: 12/04/2018 CLINICAL DATA:  Right renal mass EXAM: MRI ABDOMEN WITHOUT AND WITH CONTRAST TECHNIQUE: Multiplanar multisequence MR imaging of the abdomen was performed both before and after the administration of intravenous contrast. CONTRAST:  6 mL Gadovist IV COMPARISON:  CT abdomen/pelvis dated 12/03/2018. CT chest dated 12/03/2018. FINDINGS: Lower chest: Pleural-based fluid/gas collection along the lateral right hemithorax, compatible with empyema, incompletely visualized. Hepatobiliary: Liver is within normal limits. Status post cholecystectomy. No intrahepatic or extrahepatic ductal dilatation. Pancreas:  Within normal limits. Spleen:  Within normal limits. Adrenals/Urinary Tract:  Adrenal glands are within normal limits. Complex cystic lesion with multiple enhancing septations and thick enhancing rim along the posterior right pararenal space, measuring 6.1 x 7.9 x 8.8 cm, corresponding to the CT abnormality. Associated restricted diffusion. No intrinsic hyperintensity on precontrast T1 imaging to suggest hemorrhage. No solid enhancing component. Lesion appears to arise from the posterior left lower kidney (series 15/image 61), favoring a perinephric abscess. 11 mm cyst in the medial left upper kidney (series 15/image 5). No hydronephrosis. Stomach/Bowel: Stomach is within normal limits. Visualized bowel is unremarkable. Vascular/Lymphatic:  No evidence of abdominal aortic aneurysm. No suspicious abdominal  lymphadenopathy. Other:  No abdominal ascites. Musculoskeletal: No focal osseous lesions. IMPRESSION: 8.8 cm abscess in the posterior right pararenal space, arising from the posterior right lower kidney, corresponding to the CT abnormality. Right pleural empyema, incompletely visualized, better evaluated on CT chest. Electronically Signed   By: Julian Hy M.D.   On: 12/04/2018 00:31   Ct Perc Pleural Drain W/[place]well Cath W/img Guide  Result Date: 12/04/2018 INDICATION: 63 year old male with loculated hydropneumothorax concerning for empyema. EXAM: CT-guided chest tube placement MEDICATIONS: The patient is currently admitted to the hospital and receiving intravenous antibiotics. The antibiotics were administered within an appropriate time frame prior to the initiation of the procedure. ANESTHESIA/SEDATION: Fentanyl 1 mcg IV; Versed 50 mg IV Moderate Sedation Time:  18 minutes The patient was continuously monitored during the procedure by the interventional radiology nurse under my direct supervision. COMPLICATIONS: None immediate. PROCEDURE: Informed written consent was obtained from the patient after a thorough discussion of the procedural risks, benefits and alternatives. All questions were addressed. A timeout was performed prior to the initiation of the procedure. A planning axial CT scan was performed. The large loculated hydropneumothorax in the lateral aspect of the right chest was identified. A suitable skin entry site was selected and marked. The overlying skin was sterilely prepped and draped in the standard fashion using chlorhexidine skin prep. Local anesthesia was attained by infiltration with 1% lidocaine. A small dermatotomy was made. Under real-time sonographic guidance, an 18 gauge trocar needle was advanced into the collection. An Amplatz wire was advanced into the pleural space. The needle was removed.  The skin tract was dilated to 20 [place] and a 20 [place] Thal drain was advanced over  the wire and into the cephalad aspect of the collection. Aspiration yields a small volume of thick fluid. The catheter was secured to the skin with 0 Prolene suture and connected to wall suction via a pleurevac. Post placement CT imaging demonstrates improvement in the loculated fluid and gas collection with some re-expansion of the lung. The drain is well positioned. IMPRESSION: Successful placement of a 20 French percutaneous thoracostomy tube into the right sided hydropneumothorax. Aspiration yields thick bloody fluid which was sent for cytology as requested. Signed, Criselda Peaches, MD, [facility] Electronically Signed   By: Jacqulynn Cadet M.D.   On: 12/04/2018 18:45    Labs:  CBC: Recent Labs    12/02/18 1645 12/03/18 0336 12/05/18 0812  WBC 18.9* 17.9* 17.1*  HGB 9.4* 10.4* 10.3*  HCT 28.3* 31.8* 31.0*  PLT 601* 627* 602*    COAGS: Recent Labs    12/04/18 0316  INR 1.3*    BMP: Recent Labs    12/02/18 1800 12/03/18 0336 12/05/18 0812  NA 129* 134* 135  K 3.1* 3.1* 4.0  CL 97* 100 102  CO2 21* 24 25  GLUCOSE 136* 104* 105*  BUN 10 6* 10  CALCIUM 8.1* 8.3* 8.5*  CREATININE 0.91 0.83 0.95  GFRNONAA >60 >60 >60  GFRAA >60 >60 >60    LIVER FUNCTION TESTS: Recent Labs    12/02/18 1800 12/05/18 0812  BILITOT 0.3 0.5  AST 21 16  ALT 17 18  ALKPHOS 136* 122  PROT 6.3* 6.3*  ALBUMIN 2.0* 1.9*    Assessment and Plan:  63 y/o M s/p right chest tube placement 7/30 by Dr. Laurence Ferrari for hydropneumothorax - chest tube stable, 42 cc serosanguineous OP in Pleurvac. Planned for VATS with CT surgery.  Request has been made to IR for aspiration/possible drain placement of right perinephritic abscess seen on CT abd/pelvis from 7/29 and further evaluated with MR abd w/ and w/o contrast yesterday. This patient was reviewed by Dr. Vernard Gambles who approves procedure. Procedure planned in CT tomorrow AM (8/1)  as patient received Lovenox this afternoon prior to drain placement. I have placed orders for NPO after midnight and for Lovenox to be held tomorrow - may be resumed on 8/2 pending any other planned procedures. I have also relayed to his RN that patient requested to speak with patient liaison during my exam today.   Risks and benefits discussed with the patient including bleeding, infection, damage to adjacent structures, bowel perforation/fistula connection, and sepsis.  All of the patient's questions were answered, patient is agreeable to proceed.  Consent signed and in chart.  Please call IR with questions or concerns.   Electronically Signed: Joaquim Nam, PA-C 12/05/2018, 3:05 PM   I spent a total of 35 Minutes at the the patient's bedside AND on the patient's hospital floor or unit, greater than 50% of which was counseling/coordinating care for right chest tube follow up and right perinephritic abscess drain consult.

## 2018-12-05 NOTE — Progress Notes (Signed)
Initial Nutrition Assessment  DOCUMENTATION CODES:   Not applicable  INTERVENTION:   -MVI with minerals daily -Snacks TID  NUTRITION DIAGNOSIS:   Increased nutrient needs related to acute illness as evidenced by estimated needs.  GOAL:   Patient will meet greater than or equal to 90% of their needs  MONITOR:   PO intake, Supplement acceptance, Labs, Weight trends, Skin, I & O's  REASON FOR ASSESSMENT:   Consult Assessment of nutrition requirement/status  ASSESSMENT:   Kevin [name] is a 63 y.o. 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  Patient states he has been coughing for the past 2 days.  He previously had slight discomfort in 1 of his ribs on the right side.  Denies any left-sided or substernal chest pain/pressure.  Denies any injuries to his ribs.  States he went to his PCPs office yesterday and was told his white blood cell count was high on labs.  He was also told that his chest x-ray showed an infection.  He was advised to come into the ED for further evaluation.  Denies any fevers or chills.  Reports feeling slightly short of breath with exertion for the past few days.  Denies any nausea, vomiting, abdominal pain, or diarrhea.  No other complaints.  Pt admitted with large rt sided empyema.   7/31- s/p Placement of a 85F Thall drain into the loculated right hydropneumothorax.  Small volume aspirated fluid sent for cytology as requested.   Reviewed I/O's: +567 ml x 24 hours and 67 ml x 24 hours  Chest tube output: 42 ml x 24 hours  Per CVTS notes, MRI likely consistent with perinephric abscess. Plan for R VATS once both collections have been drained.  Spoke with pt at bedside, who was frustrated at time of visit, due to miscommunications regarding tests and procedures. Pt reports  that he did not eat today, due to anticipated procedure, however, this will not occur today. Pt reports he generally has a good appetite and has been consuming 100% of meals during hospitalization. PTA, pt consumes 3 meals (Breakfast: egg, potato, and breakfast meat; Lunch: sandwich or leftovers; Dinner: meat, starch, and vegetables). Pt also reports consuming a lot of fruit and water between meals.   Reviewed wt hx; pt has experienced a 2% wt loss over the past month, which is not significant for time frame. Pt reports his weight has been stable over the past 10 years, since undergoing cancer treatments for head and neck cancer.  Discussed importance of good meal intake to promote healing. Pt with no further concerns regarding nutrition at this time, but expressed appreciation for visit.   Albumin has a half-life of 21 days and is strongly affected by stress response and inflammatory process, therefore, do not expect to see an improvement in this lab value during acute hospitalization. When a patient presents with low albumin, it is likely skewed due to the acute inflammatory response.  Unless it is suspected that patient had poor PO intake or malnutrition prior to admission, then RD should not be consulted solely for low albumin. Note that low albumin is no longer used to diagnose malnutrition; [place] uses the new malnutrition guidelines published by the American Society for Parenteral and Enteral Nutrition (A.S.P.E.N.) and the Academy of Nutrition and Dietetics (AND).    Labs reviewed.   NUTRITION - FOCUSED PHYSICAL  EXAM:    Most Recent Value  Orbital Region  No depletion  Upper Arm Region  No depletion  Thoracic and Lumbar Region  No depletion  Buccal Region  No depletion  Temple Region  No depletion  Clavicle Bone Region  No depletion  Clavicle and Acromion Bone Region  No depletion  Scapular Bone Region  No depletion  Dorsal Hand  No depletion  Patellar Region  No depletion   Anterior Thigh Region  No depletion  Posterior Calf Region  No depletion  Edema (RD Assessment)  None  Hair  Reviewed  Eyes  Reviewed  Mouth  Reviewed  Skin  Reviewed  Nails  Reviewed       Diet Order:   Diet Order            Diet NPO time specified Except for: Sips with Meds  Diet effective midnight        Diet regular Room service appropriate? No; Fluid consistency: Thin  Diet effective now              EDUCATION NEEDS:   Education needs have been addressed  Skin:  Skin Assessment: Reviewed RN Assessment  Last BM:  12/02/18  Height:   Ht Readings from Last 1 Encounters:  12/03/18 5\' 6"  (1.676 m)    Weight:   Wt Readings from Last 1 Encounters:  12/03/18 60.2 kg    Ideal Body Weight:  64.5 kg  BMI:  Body mass index is 21.42 kg/m.  Estimated Nutritional Needs:   Kcal:  1800-2000  Protein:  90-105 grams  Fluid:  > 1.8 L    Talecia Sherlin A. Jimmye Norman, RD, LDN, [place] Registered Dietitian II Certified Diabetes Care and Education Specialist Pager: [phone] After hours Pager: [phone]

## 2018-12-05 NOTE — Progress Notes (Signed)
PROGRESS NOTE  Kevin [name]:[id number] DOB: [birth year] DOA: 12/02/2018 PCP: Berkley Harvey, NP  HPI/Recap of past 24 hours: Kevin Appl Lundeenis a 63 y.o.malewith medical history significant ofhead and neck cancer status post radiation, hypertension, hypothyroidism, pulmonary nodule presenting to the hospital for evaluation of cough, abnormal labs, and abnormal chest x-ray. Patient states he has been coughing for the past 2 days. He previously had slight discomfort in 1 of his ribs on the right side. Denies any left-sided or substernal chest pain/pressure. Denies any injuries to his ribs. States he went to his PCPs office yesterday and was told his white blood cell count was high on labs. He was also told that his chest x-ray showed an infection. He was advised to come into the ED for further evaluation. Denies any fevers or chills. Reports feeling slightly short of breath with exertion for the past few days. Denies any nausea, vomiting, abdominal pain, or diarrhea. No other complaints.  Chest CT showing a large loculated gas and fluid collection within the right lateral and posterior thorax abutting the pleural surface, consistent with a large empyema measuring approximately 10 x 5.3 x 14.9 cm and is contiguous with a smaller loculated fluid collection at the right lung base. Patient received vancomycin, cefepime, and 1 L IV fluid bolus in the ED.  Interventional radiology consulted for CT-guided chest tube placement done on 12/04/18.    12/05/18: Patient seen and examined. No Acute events overnight.  His cough is better. Right Chest pain at site of chest tube is well controlled on current pain management.  Interventional radiology consulted for possible right perinephric abscess drainage.  Assessment/Plan: Principal Problem:   Empyema (HCC) Active Problems:   Hypothyroid   Pulmonary nodule   Anemia   Hyponatremia  Large R empyema CT guided drain placement on 12/04/18 by  IR Continue tube to wall suction C/w IV zosyn and IV vancomycin Maintain O2 sat> 92% O2 saturation 96% on room air Cytology is pending Plan for R VATS once R. perinephric abscess has been drained per CTS.  Right Perinephric abscess possible source of R empyema MRI consistent w finding as above on CT abd pelvis Leukocytosis slowly improving IR and CTS following C/w zosyn empirically C/w IV vancomycin as rec by CTS, monitor renal function Monitor fever curve and wbc Interventional radiology consulted for possible right perinephric abscess drainage. Blood cultures x2 peripherally negative to date  Thrombocythemia Platelets on presentation greater than 600K, could be reactive Start Lovenox DVT prophylaxis  Hx of oropharyngeal cancer Follow up outpatient with hemoncology  Resolved hypokalemia post repletion  Resolved hypovolemic hyponatremia Na+ 135 from 129 on presentation  Severe protein calorie malnutrition Albumin 1.9 BMI 21 Dietitian consult  Physical debility PT OT process Fall precaution   DVT prophylaxis:  Subcu Lovenox daily Code Status: Patient wishes to be full code. Family Communication: No family available at this time. Disposition Plan:  Discharge to home when cardiothoracic surgery signs off. Consults called: IR   Objective: Vitals:   12/04/18 1330 12/04/18 2104 12/05/18 0628 12/05/18 0907  BP:  103/63 112/70 117/75  Pulse:  82 69 77  Resp:    15  Temp: 98.2 F (36.8 C) 98.2 F (36.8 C) 98 F (36.7 C) 98.4 F (36.9 C)  TempSrc: Oral Oral Oral Oral  SpO2:  95% 95% 96%  Weight:      Height:        Intake/Output Summary (Last 24 hours) at 12/05/2018 1120 Last data filed  at 12/05/2018 0551 Gross per 24 hour  Intake 609.94 ml  Output 43 ml  Net 566.94 ml   Filed Weights   12/03/18 0215 12/03/18 1659  Weight: 59 kg 60.2 kg    Exam:   General: 63 y.o. year-old male Thin, no acute distress.  Alert and oriented x4.    Cardiovascular:  Regular rate and rhythm no rubs or gallops no JVD or thyromegaly noted.  Respiratory: No wheezes or rales.  Poor inspiratory effort.  Site of chest tube.  Side left chest tube not Erythematous or edematous.  Abdomen: Soft nontender nondistended normal bowel sounds present.   Musculoskeletal: Trace lower extremity edema in lower extremities.  Right greater than left.  2/4 pulses in all 4 extremities.    Psychiatry: Mood is appropriate for condition and setting.   Data Reviewed: CBC: Recent Labs  Lab 12/02/18 1645 12/03/18 0336 12/05/18 0812  WBC 18.9* 17.9* 17.1*  NEUTROABS 16.3*  --  14.7*  HGB 9.4* 10.4* 10.3*  HCT 28.3* 31.8* 31.0*  MCV 81.6 83.0 81.4  PLT 601* 627* 242*   Basic Metabolic Panel: Recent Labs  Lab 12/02/18 1800 12/03/18 0336 12/05/18 0812  NA 129* 134* 135  K 3.1* 3.1* 4.0  CL 97* 100 102  CO2 21* 24 25  GLUCOSE 136* 104* 105*  BUN 10 6* 10  CREATININE 0.91 0.83 0.95  CALCIUM 8.1* 8.3* 8.5*  MG  --  1.8 2.1  PHOS  --   --  3.5   GFR: Estimated Creatinine Clearance: 69.5 mL/min (by C-G formula based on SCr of 0.95 mg/dL). Liver Function Tests: Recent Labs  Lab 12/02/18 1800 12/05/18 0812  AST 21 16  ALT 17 18  ALKPHOS 136* 122  BILITOT 0.3 0.5  PROT 6.3* 6.3*  ALBUMIN 2.0* 1.9*   No results for input(s): LIPASE, AMYLASE in the last 168 hours. No results for input(s): AMMONIA in the last 168 hours. Coagulation Profile: Recent Labs  Lab 12/04/18 0316  INR 1.3*   Cardiac Enzymes: No results for input(s): CKTOTAL, CKMB, CKMBINDEX, TROPONINI in the last 168 hours. BNP (last 3 results) No results for input(s): PROBNP in the last 8760 hours. HbA1C: No results for input(s): HGBA1C in the last 72 hours. CBG: No results for input(s): GLUCAP in the last 168 hours. Lipid Profile: No results for input(s): CHOL, HDL, LDLCALC, TRIG, CHOLHDL, LDLDIRECT in the last 72 hours. Thyroid Function Tests: No results for input(s): TSH, T4TOTAL,  FREET4, T3FREE, THYROIDAB in the last 72 hours. Anemia Panel: Recent Labs    12/03/18 0336  FERRITIN 1,292*  TIBC 162*  IRON 14*   Urine analysis:    Component Value Date/Time   COLORURINE STRAW (A) 12/03/2018 0343   APPEARANCEUR CLEAR 12/03/2018 0343   LABSPEC 1.011 12/03/2018 0343   PHURINE 7.0 12/03/2018 0343   GLUCOSEU NEGATIVE 12/03/2018 0343   HGBUR NEGATIVE 12/03/2018 0343   BILIRUBINUR NEGATIVE 12/03/2018 0343   KETONESUR NEGATIVE 12/03/2018 0343   PROTEINUR NEGATIVE 12/03/2018 0343   NITRITE NEGATIVE 12/03/2018 0343   LEUKOCYTESUR NEGATIVE 12/03/2018 0343   Sepsis Labs: @LABRCNTIP (procalcitonin:4,lacticidven:4)  ) Recent Results (from the past 240 hour(s))  Blood culture (routine x 2)     Status: None (Preliminary result)   Collection Time: 12/02/18 11:29 PM   Specimen: BLOOD  Result Value Ref Range Status   Specimen Description BLOOD RIGHT ANTECUBITAL  Final   Special Requests   Final    BOTTLES DRAWN AEROBIC AND ANAEROBIC Blood Culture results may not  be optimal due to an excessive volume of blood received in culture bottles   Culture   Final    NO GROWTH 1 DAY Performed at [facility], [place] [address] [place], [place] [address]    Report Status PENDING  Incomplete  Blood culture (routine x 2)     Status: None (Preliminary result)   Collection Time: 12/02/18 11:38 PM   Specimen: BLOOD  Result Value Ref Range Status   Specimen Description BLOOD LEFT ANTECUBITAL  Final   Special Requests   Final    BOTTLES DRAWN AEROBIC AND ANAEROBIC Blood Culture results may not be optimal due to an excessive volume of blood received in culture bottles   Culture   Final    NO GROWTH 1 DAY Performed at [facility], [place] [address], [place], [place] [address]    Report Status PENDING  Incomplete  SARS Coronavirus 2 (CEPHEID- Performed in [place] [facility] lab), Hosp Order     Status: None   Collection Time: 12/03/18  1:04 AM   Specimen: Nasopharyngeal  Swab  Result Value Ref Range Status   SARS Coronavirus 2 NEGATIVE NEGATIVE Final    Comment: (NOTE) If result is NEGATIVE SARS-CoV-2 target nucleic acids are NOT DETECTED. The SARS-CoV-2 RNA is generally detectable in upper and lower  respiratory specimens during the acute phase of infection. The lowest  concentration of SARS-CoV-2 viral copies this assay can detect is 250  copies / mL. A negative result does not preclude SARS-CoV-2 infection  and should not be used as the sole basis for treatment or other  patient management decisions.  A negative result may occur with  improper specimen collection / handling, submission of specimen other  than nasopharyngeal swab, presence of viral mutation(s) within the  areas targeted by this assay, and inadequate number of viral copies  (<250 copies / mL). A negative result must be combined with clinical  observations, patient history, and epidemiological information. If result is POSITIVE SARS-CoV-2 target nucleic acids are DETECTED. The SARS-CoV-2 RNA is generally detectable in upper and lower  respiratory specimens dur ing the acute phase of infection.  Positive  results are indicative of active infection with SARS-CoV-2.  Clinical  correlation with patient history and other diagnostic information is  necessary to determine patient infection status.  Positive results do  not rule out bacterial infection or co-infection with other viruses. If result is PRESUMPTIVE POSTIVE SARS-CoV-2 nucleic acids MAY BE PRESENT.   A presumptive positive result was obtained on the submitted specimen  and confirmed on repeat testing.  While 2019 novel coronavirus  (SARS-CoV-2) nucleic acids may be present in the submitted sample  additional confirmatory testing may be necessary for epidemiological  and / or clinical management purposes  to differentiate between  SARS-CoV-2 and other Sarbecovirus currently known to infect humans.  If clinically indicated  additional testing with an alternate test  methodology [phone]) is advised. The SARS-CoV-2 RNA is generally  detectable in upper and lower respiratory sp ecimens during the acute  phase of infection. The expected result is Negative. Fact Sheet for Patients:  StrictlyIdeas.no Fact Sheet for Healthcare Providers: BankingDealers.co.za This test is not yet approved or cleared by the [place] FDA and has been authorized for detection and/or diagnosis of SARS-CoV-2 by FDA under an Emergency Use Authorization (EUA).  This EUA will remain in effect (meaning this test can be used) for the duration of the COVID-19 declaration under Section 564(b)(1) of the Act,  21 U.S.C. section 360bbb-3(b)(1), unless the authorization is terminated or revoked sooner. Performed at [facility], [place] [address], [place], [place] [address]       Studies: Ct Perc Pleural Drain W/indwell Cath W/img Guide  Result Date: 12/04/2018 INDICATION: 63 year old male with loculated hydropneumothorax concerning for empyema. EXAM: CT-guided chest tube placement MEDICATIONS: The patient is currently admitted to the hospital and receiving intravenous antibiotics. The antibiotics were administered within an appropriate time frame prior to the initiation of the procedure. ANESTHESIA/SEDATION: Fentanyl 1 mcg IV; Versed 50 mg IV Moderate Sedation Time:  18 minutes The patient was continuously monitored during the procedure by the interventional radiology nurse under my direct supervision. COMPLICATIONS: None immediate. PROCEDURE: Informed written consent was obtained from the patient after a thorough discussion of the procedural risks, benefits and alternatives. All questions were addressed. A timeout was performed prior to the initiation of the procedure. A planning axial CT scan was performed. The large loculated hydropneumothorax in the lateral aspect of the right chest was  identified. A suitable skin entry site was selected and marked. The overlying skin was sterilely prepped and draped in the standard fashion using chlorhexidine skin prep. Local anesthesia was attained by infiltration with 1% lidocaine. A small dermatotomy was made. Under real-time sonographic guidance, an 18 gauge trocar needle was advanced into the collection. An Amplatz wire was advanced into the pleural space. The needle was removed. The skin tract was dilated to 20 [place] and a 20 [place] Thal drain was advanced over the wire and into the cephalad aspect of the collection. Aspiration yields a small volume of thick fluid. The catheter was secured to the skin with 0 Prolene suture and connected to wall suction via a pleurevac. Post placement CT imaging demonstrates improvement in the loculated fluid and gas collection with some re-expansion of the lung. The drain is well positioned. IMPRESSION: Successful placement of a 20 French percutaneous thoracostomy tube into the right sided hydropneumothorax. Aspiration yields thick bloody fluid which was sent for cytology as requested. Signed, Criselda Peaches, MD, [facility] Radiology Electronically Signed   By: Jacqulynn Cadet M.D.   On: 12/04/2018 18:45    Scheduled Meds:  amLODipine  10 mg Oral Daily   ferrous sulfate  325 mg Oral BID WC   levothyroxine  75 mcg Oral QAC breakfast   sodium chloride flush  3 mL Intravenous Once    Continuous Infusions:  piperacillin-tazobactam (ZOSYN)  IV 3.375 g (12/05/18 0740)   vancomycin 750 mg (12/05/18 0549)     LOS: 2 days     Kayleen Memos, MD [facility] Pager [phone]  If 7PM-7AM, please contact night-coverage [email or web address] Password [email or web address] 12/05/2018, 11:20 AM

## 2018-12-05 NOTE — Progress Notes (Signed)
Occupational Therapy Evaluation Patient Details Name: Kevin [name] MRN: [id number] DOB: Apr 14, 1956 Today's Date: 12/05/2018    History of Present Illness Kevin Henneman Lundeenis a 63 y.o.malewith medical history significant ofhead and neck cancer status post radiation, hypertension, hypothyroidism, pulmonary nodule presenting to the hospital for evaluation of cough, abnormal labs, and abnormal chest x-ray. Chest CT showing a large loculated gas and fluid collection within the right lateral and posterior thorax abutting the pleural surface, consistent with a large empyema.   Clinical Impression   PTA, pt was living at home with his daughter and wife and reports he was independent with all ADL/IADL and functional mobility. Pt reports he was walking 100miles and was very active. Pt currently reports he feels he is functioning at baseline despite management for chest tube. Pt completed ADL and functional mobility modified independently-independently. Pt demonstrated safe and appropriate management of IV pole and chest tube during functional mobility. Patient evaluated by Occupational Therapy with no further acute OT needs identified. All education has been completed and the patient has no further questions. See below for any follow-up Occupational Therapy or equipment needs. OT to sign off. Thank you for referral.      Follow Up Recommendations  No OT follow up    Equipment Recommendations  None recommended by OT    Recommendations for Other Services       Precautions / Restrictions Precautions Precautions: Fall;Other (comment)(chest tube) Precaution Comments: chest tube Restrictions Weight Bearing Restrictions: No      Mobility Bed Mobility Overal bed mobility: Independent                Transfers Overall transfer level: Modified independent Equipment used: None             General transfer comment: pt demonstrated safe and appropriate management of his chest tube and  IV pole    Balance Overall balance assessment: Independent                                         ADL either performed or assessed with clinical judgement   ADL Overall ADL's : At baseline                                       General ADL Comments: pt modified independent-independent with ADL;pt reports he is at baseline level despite chest tube; reports he was been ambulating to/from the bathroom and managing chest tube and IV pole;pt demonstrated management;     Vision Baseline Vision/History: Wears glasses Wears Glasses: Reading only Patient Visual Report: No change from baseline       Perception     Praxis      Pertinent Vitals/Pain Pain Assessment: 0-10 Pain Score: 2  Pain Location: discomfort with chest tube Pain Descriptors / Indicators: Discomfort Pain Intervention(s): Limited activity within patient's tolerance;Monitored during session     Hand Dominance Right   Extremity/Trunk Assessment Upper Extremity Assessment Upper Extremity Assessment: Overall WFL for tasks assessed   Lower Extremity Assessment Lower Extremity Assessment: Overall WFL for tasks assessed   Cervical / Trunk Assessment Cervical / Trunk Assessment: Normal   Communication Communication Communication: No difficulties   Cognition Arousal/Alertness: Awake/alert Behavior During Therapy: WFL for tasks assessed/performed Overall Cognitive Status: Within Functional Limits for tasks assessed  General Comments       Exercises     Shoulder Instructions      Home Living Family/patient expects to be discharged to:: Private residence Living Arrangements: Spouse/significant other;Children Available Help at Discharge: Family;Available PRN/intermittently Type of Home: House Home Access: Stairs to enter Entrance Stairs-Number of Steps: 5   Home Layout: Two level Alternate Level Stairs-Number of Steps:  15   Bathroom Shower/Tub: Walk-in shower;Tub/shower Psychologist, forensic: Yes How Accessible: Accessible via walker Home Equipment: None          Prior Functioning/Environment Level of Independence: Independent        Comments: Pt was driving, walking 100miles, and reports he was very active        OT Problem List: Pain;Decreased knowledge of precautions      OT Treatment/Interventions:      OT Goals(Current goals can be found in the care plan section) Acute Rehab OT Goals Patient Stated Goal: to go home OT Goal Formulation: With patient Time For Goal Achievement: 12/19/18 Potential to Achieve Goals: Good  OT Frequency:     Barriers to D/C:            Co-evaluation              AM-PAC OT "6 Clicks" Daily Activity     Outcome Measure Help from another person eating meals?: None Help from another person taking care of personal grooming?: None Help from another person toileting, which includes using toliet, bedpan, or urinal?: None Help from another person bathing (including washing, rinsing, drying)?: None Help from another person to put on and taking off regular upper body clothing?: None Help from another person to put on and taking off regular lower body clothing?: None 6 Click Score: 24   End of Session Equipment Utilized During Treatment: (chest tube) Nurse Communication: Mobility status  Activity Tolerance: Patient tolerated treatment well Patient left: in bed;with call bell/phone within reach  OT Visit Diagnosis: Other abnormalities of gait and mobility (R26.89);Pain Pain - part of body: (chest tube site)                Time: 1400-1411 OT Time Calculation (min): 11 min Charges:  OT General Charges $OT Visit: 1 Visit OT Evaluation $OT Eval Low Complexity: [place] OTR/L Acute Rehabilitation Services Office: [place] 12/05/2018, 2:21 PM

## 2018-12-06 ENCOUNTER — Inpatient Hospital Stay (HOSPITAL_COMMUNITY): Payer: BC Managed Care – PPO

## 2018-12-06 LAB — BASIC METABOLIC PANEL
Anion gap: 7 (ref 5–15)
BUN: 11 mg/dL (ref 8–23)
CO2: 26 mmol/L (ref 22–32)
Calcium: 8.4 mg/dL — ABNORMAL LOW (ref 8.9–10.3)
Chloride: 101 mmol/L (ref 98–111)
Creatinine, Ser: 1.06 mg/dL (ref 0.61–1.24)
GFR calc Af Amer: >60 mL/min (ref >60)
GFR calc non Af Amer: >60 mL/min (ref >60)
Glucose, Bld: 102 mg/dL — ABNORMAL HIGH (ref 70–99)
Potassium: 3.5 mmol/L (ref 3.5–5.1)
Sodium: 134 mmol/L — ABNORMAL LOW (ref 135–145)

## 2018-12-06 LAB — CBC
HCT: 32.3 % — ABNORMAL LOW (ref 39.0–52.0)
Hemoglobin: 10.7 g/dL — ABNORMAL LOW (ref 13.0–17.0)
MCH: 26.8 pg (ref 26.0–34.0)
MCHC: 33.1 g/dL (ref 30.0–36.0)
MCV: 81 fL (ref 80.0–100.0)
Platelets: 666 10*3/uL — ABNORMAL HIGH (ref 150–400)
RBC: 3.99 MIL/uL — ABNORMAL LOW (ref 4.22–5.81)
RDW: 14.6 % (ref 11.5–15.5)
WBC: 17.1 10*3/uL — ABNORMAL HIGH (ref 4.0–10.5)
nRBC: 0 % (ref 0.0–0.2)

## 2018-12-06 MED ORDER — FENTANYL CITRATE (PF) 100 MCG/2ML IJ SOLN
INTRAMUSCULAR | Status: AC
  Filled 2018-12-06: qty 4

## 2018-12-06 MED ORDER — LIDOCAINE HCL 1 % IJ SOLN
INTRAMUSCULAR | Status: AC
  Filled 2018-12-06: qty 20

## 2018-12-06 MED ORDER — MIDAZOLAM HCL 2 MG/2ML IJ SOLN
INTRAMUSCULAR | Status: AC | PRN
  Administered 2018-12-06: 1 mg via INTRAVENOUS
  Administered 2018-12-06 (×2): 0.5 mg via INTRAVENOUS

## 2018-12-06 MED ORDER — SODIUM CHLORIDE 0.9% FLUSH
5.0000 mL | Freq: Three times a day (TID) | INTRAVENOUS | Status: DC
  Administered 2018-12-06 – 2018-12-13 (×20): 5 mL

## 2018-12-06 MED ORDER — FENTANYL CITRATE (PF) 100 MCG/2ML IJ SOLN
INTRAMUSCULAR | Status: AC | PRN
  Administered 2018-12-06 (×4): 25 ug via INTRAVENOUS

## 2018-12-06 MED ORDER — HYDROCODONE-ACETAMINOPHEN 5-325 MG PO TABS: 1.0000 | ORAL_TABLET | ORAL | Status: DC | PRN

## 2018-12-06 MED ORDER — MIDAZOLAM HCL 2 MG/2ML IJ SOLN
INTRAMUSCULAR | Status: AC
  Filled 2018-12-06: qty 4

## 2018-12-06 NOTE — Procedures (Signed)
  Procedure: CT drainage of R retroperitoneal abscess 46ml purulent aspirate for GS, C&S EBL:   minimal Complications:  none immediate  See full dictation in [employer].  Dillard Cannon MD Main # [phone] Pager  [phone]

## 2018-12-06 NOTE — Progress Notes (Signed)
Patient back to unit from CT for R drain placement. Dressing clean, dry & intact. Vital signs monitored Q30 minutes per orders. Patient alert and oriented. Tylenol administered for mild pain located in chest tube insertion site. Will continue to monitor.  Hiram Comber, RN 12/06/2018 11:23 AM

## 2018-12-06 NOTE — Progress Notes (Signed)
PROGRESS NOTE  Kevin [name] OEV:[id number] DOB: [birth year] DOA: 12/02/2018 PCP: Berkley Harvey, NP  HPI/Recap of past 24 hours: Kevin Appl Lundeenis a 63 y.o.malewith medical history significant ofhead and neck cancer status post radiation, hypertension, hypothyroidism, pulmonary nodule presenting to the hospital for evaluation of cough, abnormal labs, and abnormal chest x-ray. Patient states he has been coughing for the past 2 days. He previously had slight discomfort in 1 of his ribs on the right side. Denies any left-sided or substernal chest pain/pressure. Denies any injuries to his ribs. States he went to his PCPs office yesterday and was told his white blood cell count was high on labs. He was also told that his chest x-ray showed an infection. He was advised to come into the ED for further evaluation. Denies any fevers or chills. Reports feeling slightly short of breath with exertion for the past few days. Denies any nausea, vomiting, abdominal pain, or diarrhea. No other complaints.  Chest CT showing a large loculated gas and fluid collection within the right lateral and posterior thorax abutting the pleural surface, consistent with a large empyema measuring approximately 10 x 5.3 x 14.9 cm and is contiguous with a smaller loculated fluid collection at the right lung base. Patient received vancomycin, cefepime, and 1 L IV fluid bolus in the ED.  Interventional radiology consulted for CT-guided chest tube placement done on 12/04/18.    Interventional radiology consulted for right perinephric abscess drainage. Completed on 12/06/18.  12/06/18: Patient was seen and examined at his bedside this morning.  Reports pain at chest tube insertion present only when he moves but overall well controlled on current pain management.  Addressed frustrations, all questions answered to his satisfaction.  Assessment/Plan: Principal Problem:   Empyema (HCC) Active Problems:   Hypothyroid  Pulmonary nodule   Anemia   Hyponatremia  Large R empyema post chest tube insertion with drain on 12/04/2018 by interventional radiology Continue tube to wall suction Continue antibiotics empirically, IV zosyn and IV vancomycin Continue to maintain O2 sat> 92% Currently saturating greater than 94% on room air No malignancy on cytology. Plan for R VATS once R. perinephric abscess has been drained per CTS.  Right Perinephric abscess possible source of R empyema post CT-guided right drain placement on 12/06/2018 by interventional radiology MRI consistent w finding as above on CT abd pelvis Leukocytosis WBC 17 K Continue to monitor fever curve and WBC Repeat CBC in the morning Continue IV antibiotics empirically  Thrombocythemia Platelets on presentation greater than 600K, could be reactive Continue Lovenox DVT prophylaxis  Hx of oropharyngeal cancer Follow up outpatient with hemoncology  Resolved hypokalemia post repletion  Resolving hypovolemic hyponatremia Na+ 135 from 129 on presentation Sodium today 134 Asymptomatic.  Severe protein calorie malnutrition Albumin 1.9 BMI 21 Dietitian consult  Physical debility PT OT.  No PT follow-up recommended by PT   DVT prophylaxis:  Subcu Lovenox daily Code Status: Patient wishes to be full code. Family Communication: No family available at this time. Disposition Plan:  Discharge to home when cardiothoracic surgery signs off. Consults called: IR, cardiothoracic surgery   Objective: Vitals:   12/06/18 1111 12/06/18 1136 12/06/18 1204 12/06/18 1408  BP: 112/79 118/83 108/73 99/64  Pulse: 81 77 73 76  Resp:    19  Temp:    97.7 F (36.5 C)  TempSrc:    Oral  SpO2:    98%  Weight:      Height:  Intake/Output Summary (Last 24 hours) at 12/06/2018 1457 Last data filed at 12/06/2018 1416 Gross per 24 hour  Intake 897.58 ml  Output 32 ml  Net 865.58 ml   Filed Weights   12/03/18 0215 12/03/18 1659  Weight: 59 kg  60.2 kg    Exam:   General: 63 y.o. year-old male thin built in no acute distress.  Alert and oriented x4.    Cardiovascular: Regular rate and rhythm no rubs or gallops.  No JVD or thyromegaly noted.  Respiratory: No wheezes or rales.  Poor expiratory effort.  Site of chest tube with no erythema or edema.  Abdomen: Soft nontender nondistended normal bowel sounds present.   Musculoskeletal: Trace lower extremity edema bilaterally right greater than left.  2 out of 4 pulses in all 4 extremities.  Psychiatry: Mood is appropriate for condition and setting.   Data Reviewed: CBC: Recent Labs  Lab 12/02/18 1645 12/03/18 0336 12/05/18 0812 12/06/18 0649  WBC 18.9* 17.9* 17.1* 17.1*  NEUTROABS 16.3*  --  14.7*  --   HGB 9.4* 10.4* 10.3* 10.7*  HCT 28.3* 31.8* 31.0* 32.3*  MCV 81.6 83.0 81.4 81.0  PLT 601* 627* 602* 127*   Basic Metabolic Panel: Recent Labs  Lab 12/02/18 1800 12/03/18 0336 12/05/18 0812 12/06/18 0649  NA 129* 134* 135 134*  K 3.1* 3.1* 4.0 3.5  CL 97* 100 102 101  CO2 21* 24 25 26   GLUCOSE 136* 104* 105* 102*  BUN 10 6* 10 11  CREATININE 0.91 0.83 0.95 1.06  CALCIUM 8.1* 8.3* 8.5* 8.4*  MG  --  1.8 2.1  --   PHOS  --   --  3.5  --    GFR: Estimated Creatinine Clearance: 62.3 mL/min (by C-G formula based on SCr of 1.06 mg/dL). Liver Function Tests: Recent Labs  Lab 12/02/18 1800 12/05/18 0812  AST 21 16  ALT 17 18  ALKPHOS 136* 122  BILITOT 0.3 0.5  PROT 6.3* 6.3*  ALBUMIN 2.0* 1.9*   No results for input(s): LIPASE, AMYLASE in the last 168 hours. No results for input(s): AMMONIA in the last 168 hours. Coagulation Profile: Recent Labs  Lab 12/04/18 0316  INR 1.3*   Cardiac Enzymes: No results for input(s): CKTOTAL, CKMB, CKMBINDEX, TROPONINI in the last 168 hours. BNP (last 3 results) No results for input(s): PROBNP in the last 8760 hours. HbA1C: No results for input(s): HGBA1C in the last 72 hours. CBG: No results for input(s):  GLUCAP in the last 168 hours. Lipid Profile: No results for input(s): CHOL, HDL, LDLCALC, TRIG, CHOLHDL, LDLDIRECT in the last 72 hours. Thyroid Function Tests: No results for input(s): TSH, T4TOTAL, FREET4, T3FREE, THYROIDAB in the last 72 hours. Anemia Panel: No results for input(s): VITAMINB12, FOLATE, FERRITIN, TIBC, IRON, RETICCTPCT in the last 72 hours. Urine analysis:    Component Value Date/Time   COLORURINE STRAW (A) 12/03/2018 0343   APPEARANCEUR CLEAR 12/03/2018 0343   LABSPEC 1.011 12/03/2018 0343   PHURINE 7.0 12/03/2018 0343   GLUCOSEU NEGATIVE 12/03/2018 0343   HGBUR NEGATIVE 12/03/2018 0343   BILIRUBINUR NEGATIVE 12/03/2018 0343   KETONESUR NEGATIVE 12/03/2018 0343   PROTEINUR NEGATIVE 12/03/2018 0343   NITRITE NEGATIVE 12/03/2018 0343   LEUKOCYTESUR NEGATIVE 12/03/2018 0343   Sepsis Labs: @LABRCNTIP (procalcitonin:4,lacticidven:4)  ) Recent Results (from the past 240 hour(s))  Blood culture (routine x 2)     Status: None (Preliminary result)   Collection Time: 12/02/18 11:29 PM   Specimen: BLOOD  Result Value Ref  Range Status   Specimen Description BLOOD RIGHT ANTECUBITAL  Final   Special Requests   Final    BOTTLES DRAWN AEROBIC AND ANAEROBIC Blood Culture results may not be optimal due to an excessive volume of blood received in culture bottles   Culture   Final    NO GROWTH 3 DAYS Performed at [facility] [address] [place], [place] [address]    Report Status PENDING  Incomplete  Blood culture (routine x 2)     Status: None (Preliminary result)   Collection Time: 12/02/18 11:38 PM   Specimen: BLOOD  Result Value Ref Range Status   Specimen Description BLOOD LEFT ANTECUBITAL  Final   Special Requests   Final    BOTTLES DRAWN AEROBIC AND ANAEROBIC Blood Culture results may not be optimal due to an excessive volume of blood received in culture bottles   Culture   Final    NO GROWTH 3 DAYS Performed at [facility], [place] [address] [place], [place] [address]    Report Status PENDING  Incomplete  SARS Coronavirus 2 (CEPHEID- Performed in [place] hospital lab), Hosp Order     Status: None   Collection Time: 12/03/18  1:04 AM   Specimen: Nasopharyngeal Swab  Result Value Ref Range Status   SARS Coronavirus 2 NEGATIVE NEGATIVE Final    Comment: (NOTE) If result is NEGATIVE SARS-CoV-2 target nucleic acids are NOT DETECTED. The SARS-CoV-2 RNA is generally detectable in upper and lower  respiratory specimens during the acute phase of infection. The lowest  concentration of SARS-CoV-2 viral copies this assay can detect is 250  copies / mL. A negative result does not preclude SARS-CoV-2 infection  and should not be used as the sole basis for treatment or other  patient management decisions.  A negative result may occur with  improper specimen collection / handling, submission of specimen other  than nasopharyngeal swab, presence of viral mutation(s) within the  areas targeted by this assay, and inadequate number of viral copies  (<250 copies / mL). A negative result must be combined with clinical  observations, patient history, and epidemiological information. If result is POSITIVE SARS-CoV-2 target nucleic acids are DETECTED. The SARS-CoV-2 RNA is generally detectable in upper and lower  respiratory specimens dur ing the acute phase of infection.  Positive  results are indicative of active infection with SARS-CoV-2.  Clinical  correlation with patient history and other diagnostic information is  necessary to determine patient infection status.  Positive results do  not rule out bacterial infection or co-infection with other viruses. If result is PRESUMPTIVE POSTIVE SARS-CoV-2 nucleic acids MAY BE PRESENT.   A presumptive positive result was obtained on the submitted specimen  and confirmed on repeat testing.  While 2019 novel coronavirus  (SARS-CoV-2) nucleic acids may be present in the submitted sample    additional confirmatory testing may be necessary for epidemiological  and / or clinical management purposes  to differentiate between  SARS-CoV-2 and other Sarbecovirus currently known to infect humans.  If clinically indicated additional testing with an alternate test  methodology [phone]) is advised. The SARS-CoV-2 RNA is generally  detectable in upper and lower respiratory sp ecimens during the acute  phase of infection. The expected result is Negative. Fact Sheet for Patients:  StrictlyIdeas.no Fact Sheet for Healthcare Providers: BankingDealers.co.za This test is not yet approved or cleared by the [place] FDA and has been authorized for detection and/or diagnosis of SARS-CoV-2 by FDA  under an Emergency Use Authorization (EUA).  This EUA will remain in effect (meaning this test can be used) for the duration of the COVID-19 declaration under Section 564(b)(1) of the Act, 21 U.S.C. section 360bbb-3(b)(1), unless the authorization is terminated or revoked sooner. Performed at [facility], [place] [address], [place], [place] [address]       Studies: Ct Image Guided Drainage By Percutaneous Catheter  Result Date: 12/06/2018 CLINICAL DATA:  Empyema, post drainage. CT demonstrates complex right retroperitoneal/perinephric collection. MR suggests perinephric abscess. Drainage requested. EXAM: CT GUIDED DRAINAGE OF RIGHT RETROPERITONEAL ABSCESS ANESTHESIA/SEDATION: Intravenous Fentanyl 12mcg and Versed 2mg  were administered as conscious sedation during continuous monitoring of the patient's level of consciousness and physiological / cardiorespiratory status by the radiology RN, with a total moderate sedation time of 24 minutes. PROCEDURE: The procedure, risks, benefits, and alternatives were explained to the patient. Questions regarding the procedure were encouraged and answered. The patient understands and consents to the procedure.  Patient placed prone. Select axial scans through the upper abdomen obtained. The retroperitoneal collection was localized, and an appropriate skin entry site was determined and marked. The operative field was prepped with chlorhexidinein a sterile fashion, and a sterile drape was applied covering the operative field. A sterile gown and sterile gloves were used for the procedure. Local anesthesia was provided with 1% Lidocaine. Under CT fluoroscopic guidance, a 19 gauge percutaneous entry needle advanced into the collection. Purulent material could be aspirated. An Amplatz guidewire advanced easily within the collection, its position confirmed on CT. Tract was dilated to facilitate placement of a 12 French pigtail drain catheter, formed centrally within the collection. CT confirmed appropriate positioning. 20 mL of purulent aspirate sent for Gram stain and culture. The catheter was secured externally with 0 Prolene suture and StatLock and placed to gravity drain bag. The patient tolerated the procedure well. COMPLICATIONS: None immediate FINDINGS: Complex right retroperitoneal collection posterior to the kidney was localized. Purulent material was aspirated. 12 French drain catheter placed as above. Sample of the aspirate sent for Gram stain and culture. IMPRESSION: 1. Technically successful CT-guided right retroperitoneal drain catheter placement. Electronically Signed   By: Lucrezia Europe M.D.   On: 12/06/2018 10:38    Scheduled Meds:  amLODipine  10 mg Oral Daily   [START ON 12/07/2018] enoxaparin (LOVENOX) injection  40 mg Subcutaneous Q24H   ferrous sulfate  325 mg Oral BID WC   levothyroxine  75 mcg Oral QAC breakfast   multivitamin with minerals  1 tablet Oral Daily   sodium chloride flush  3 mL Intravenous Once   sodium chloride flush  5 mL Intracatheter Q8H    Continuous Infusions:  piperacillin-tazobactam (ZOSYN)  IV 3.375 g (12/06/18 1034)   vancomycin 750 mg (12/06/18 0436)     LOS: 3  days     Kayleen Memos, MD [facility] Pager [phone]  If 7PM-7AM, please contact night-coverage [email or web address] Password [email or web address] 12/06/2018, 2:57 PM    PROGRESS NOTE  Kevin [name] QQP:[id number] DOB: 07/03/1955 DOA: 12/02/2018 PCP: Berkley Harvey, NP  HPI/Recap of past 24 hours: Kevin Appl Lundeenis a 63 y.o.malewith medical history significant ofhead and neck cancer status post radiation, hypertension, hypothyroidism, pulmonary nodule presenting to the hospital for evaluation of cough, abnormal labs, and abnormal chest x-ray. Patient states he has been coughing for the past 2 days. He previously had slight discomfort in 1 of his ribs on the right side. Denies any left-sided or substernal chest pain/pressure. Denies any  injuries to his ribs. States he went to his PCPs office yesterday and was told his white blood cell count was high on labs. He was also told that his chest x-ray showed an infection. He was advised to come into the ED for further evaluation. Denies any fevers or chills. Reports feeling slightly short of breath with exertion for the past few days. Denies any nausea, vomiting, abdominal pain, or diarrhea. No other complaints.  Chest CT showing a large loculated gas and fluid collection within the right lateral and posterior thorax abutting the pleural surface, consistent with a large empyema measuring approximately 10 x 5.3 x 14.9 cm and is contiguous with a smaller loculated fluid collection at the right lung base. Patient received vancomycin, cefepime, and 1 L IV fluid bolus in the ED.  Interventional radiology consulted for CT-guided chest tube placement done on 12/04/18.    12/05/18: Patient seen and examined. No Acute events overnight.  His cough is better. Right Chest pain at site of chest tube is well controlled on current pain management.  Interventional radiology consulted for possible right perinephric abscess drainage.  Assessment/Plan: Principal  Problem:   Empyema (HCC) Active Problems:   Hypothyroid   Pulmonary nodule   Anemia   Hyponatremia  Large R empyema CT guided drain placement on 12/04/18 by IR Continue tube to wall suction C/w IV zosyn and IV vancomycin Maintain O2 sat> 92% O2 saturation 96% on room air Cytology is pending Plan for R VATS once R. perinephric abscess has been drained per CTS.  Right Perinephric abscess possible source of R empyema MRI consistent w finding as above on CT abd pelvis Leukocytosis slowly improving IR and CTS following C/w zosyn empirically C/w IV vancomycin as rec by CTS, monitor renal function Monitor fever curve and wbc Interventional radiology consulted for possible right perinephric abscess drainage. Blood cultures x2 peripherally negative to date  Thrombocythemia Platelets on presentation greater than 600K, could be reactive Start Lovenox DVT prophylaxis  Hx of oropharyngeal cancer Follow up outpatient with hemoncology  Resolved hypokalemia post repletion  Resolved hypovolemic hyponatremia Na+ 135 from 129 on presentation  Severe protein calorie malnutrition Albumin 1.9 BMI 21 Dietitian consult  Physical debility PT OT process Fall precaution   DVT prophylaxis:  Subcu Lovenox daily Code Status: Patient wishes to be full code. Family Communication: No family available at this time. Disposition Plan:  Discharge to home when cardiothoracic surgery signs off. Consults called: IR   Objective: Vitals:   12/06/18 1111 12/06/18 1136 12/06/18 1204 12/06/18 1408  BP: 112/79 118/83 108/73 99/64  Pulse: 81 77 73 76  Resp:    19  Temp:    97.7 F (36.5 C)  TempSrc:    Oral  SpO2:    98%  Weight:      Height:        Intake/Output Summary (Last 24 hours) at 12/06/2018 1457 Last data filed at 12/06/2018 1416 Gross per 24 hour  Intake 897.58 ml  Output 32 ml  Net 865.58 ml   Filed Weights   12/03/18 0215 12/03/18 1659  Weight: 59 kg 60.2 kg     Exam:   General: 63 y.o. year-old male Thin, no acute distress.  Alert and oriented x4.    Cardiovascular: Regular rate and rhythm no rubs or gallops no JVD or thyromegaly noted.  Respiratory: No wheezes or rales.  Poor inspiratory effort.  Site of chest tube.  Side left chest tube not Erythematous or edematous.  Abdomen: Soft  nontender nondistended normal bowel sounds present.   Musculoskeletal: Trace lower extremity edema in lower extremities.  Right greater than left.  2/4 pulses in all 4 extremities.    Psychiatry: Mood is appropriate for condition and setting.   Data Reviewed: CBC: Recent Labs  Lab 12/02/18 1645 12/03/18 0336 12/05/18 0812 12/06/18 0649  WBC 18.9* 17.9* 17.1* 17.1*  NEUTROABS 16.3*  --  14.7*  --   HGB 9.4* 10.4* 10.3* 10.7*  HCT 28.3* 31.8* 31.0* 32.3*  MCV 81.6 83.0 81.4 81.0  PLT 601* 627* 602* 563*   Basic Metabolic Panel: Recent Labs  Lab 12/02/18 1800 12/03/18 0336 12/05/18 0812 12/06/18 0649  NA 129* 134* 135 134*  K 3.1* 3.1* 4.0 3.5  CL 97* 100 102 101  CO2 21* 24 25 26   GLUCOSE 136* 104* 105* 102*  BUN 10 6* 10 11  CREATININE 0.91 0.83 0.95 1.06  CALCIUM 8.1* 8.3* 8.5* 8.4*  MG  --  1.8 2.1  --   PHOS  --   --  3.5  --    GFR: Estimated Creatinine Clearance: 62.3 mL/min (by C-G formula based on SCr of 1.06 mg/dL). Liver Function Tests: Recent Labs  Lab 12/02/18 1800 12/05/18 0812  AST 21 16  ALT 17 18  ALKPHOS 136* 122  BILITOT 0.3 0.5  PROT 6.3* 6.3*  ALBUMIN 2.0* 1.9*   No results for input(s): LIPASE, AMYLASE in the last 168 hours. No results for input(s): AMMONIA in the last 168 hours. Coagulation Profile: Recent Labs  Lab 12/04/18 0316  INR 1.3*   Cardiac Enzymes: No results for input(s): CKTOTAL, CKMB, CKMBINDEX, TROPONINI in the last 168 hours. BNP (last 3 results) No results for input(s): PROBNP in the last 8760 hours. HbA1C: No results for input(s): HGBA1C in the last 72 hours. CBG: No results  for input(s): GLUCAP in the last 168 hours. Lipid Profile: No results for input(s): CHOL, HDL, LDLCALC, TRIG, CHOLHDL, LDLDIRECT in the last 72 hours. Thyroid Function Tests: No results for input(s): TSH, T4TOTAL, FREET4, T3FREE, THYROIDAB in the last 72 hours. Anemia Panel: No results for input(s): VITAMINB12, FOLATE, FERRITIN, TIBC, IRON, RETICCTPCT in the last 72 hours. Urine analysis:    Component Value Date/Time   COLORURINE STRAW (A) 12/03/2018 0343   APPEARANCEUR CLEAR 12/03/2018 0343   LABSPEC 1.011 12/03/2018 0343   PHURINE 7.0 12/03/2018 0343   GLUCOSEU NEGATIVE 12/03/2018 0343   HGBUR NEGATIVE 12/03/2018 0343   BILIRUBINUR NEGATIVE 12/03/2018 0343   KETONESUR NEGATIVE 12/03/2018 0343   PROTEINUR NEGATIVE 12/03/2018 0343   NITRITE NEGATIVE 12/03/2018 0343   LEUKOCYTESUR NEGATIVE 12/03/2018 0343   Sepsis Labs: @LABRCNTIP (procalcitonin:4,lacticidven:4)  ) Recent Results (from the past 240 hour(s))  Blood culture (routine x 2)     Status: None (Preliminary result)   Collection Time: 12/02/18 11:29 PM   Specimen: BLOOD  Result Value Ref Range Status   Specimen Description BLOOD RIGHT ANTECUBITAL  Final   Special Requests   Final    BOTTLES DRAWN AEROBIC AND ANAEROBIC Blood Culture results may not be optimal due to an excessive volume of blood received in culture bottles   Culture   Final    NO GROWTH 3 DAYS Performed at [facility], [place] [address] [place], [place] [address]    Report Status PENDING  Incomplete  Blood culture (routine x 2)     Status: None (Preliminary result)   Collection Time: 12/02/18 11:38 PM   Specimen: BLOOD  Result Value Ref Range  Status   Specimen Description BLOOD LEFT ANTECUBITAL  Final   Special Requests   Final    BOTTLES DRAWN AEROBIC AND ANAEROBIC Blood Culture results may not be optimal due to an excessive volume of blood received in culture bottles   Culture   Final    NO GROWTH 3 DAYS Performed at [facility],  [place] [address] [place], [place] [address]    Report Status PENDING  Incomplete  SARS Coronavirus 2 (CEPHEID- Performed in [place] hospital lab), Hosp Order     Status: None   Collection Time: 12/03/18  1:04 AM   Specimen: Nasopharyngeal Swab  Result Value Ref Range Status   SARS Coronavirus 2 NEGATIVE NEGATIVE Final    Comment: (NOTE) If result is NEGATIVE SARS-CoV-2 target nucleic acids are NOT DETECTED. The SARS-CoV-2 RNA is generally detectable in upper and lower  respiratory specimens during the acute phase of infection. The lowest  concentration of SARS-CoV-2 viral copies this assay can detect is 250  copies / mL. A negative result does not preclude SARS-CoV-2 infection  and should not be used as the sole basis for treatment or other  patient management decisions.  A negative result may occur with  improper specimen collection / handling, submission of specimen other  than nasopharyngeal swab, presence of viral mutation(s) within the  areas targeted by this assay, and inadequate number of viral copies  (<250 copies / mL). A negative result must be combined with clinical  observations, patient history, and epidemiological information. If result is POSITIVE SARS-CoV-2 target nucleic acids are DETECTED. The SARS-CoV-2 RNA is generally detectable in upper and lower  respiratory specimens dur ing the acute phase of infection.  Positive  results are indicative of active infection with SARS-CoV-2.  Clinical  correlation with patient history and other diagnostic information is  necessary to determine patient infection status.  Positive results do  not rule out bacterial infection or co-infection with other viruses. If result is PRESUMPTIVE POSTIVE SARS-CoV-2 nucleic acids MAY BE PRESENT.   A presumptive positive result was obtained on the submitted specimen  and confirmed on repeat testing.  While 2019 novel coronavirus  (SARS-CoV-2) nucleic acids may be present in the submitted  sample  additional confirmatory testing may be necessary for epidemiological  and / or clinical management purposes  to differentiate between  SARS-CoV-2 and other Sarbecovirus currently known to infect humans.  If clinically indicated additional testing with an alternate test  methodology [id number]) is advised. The SARS-CoV-2 RNA is generally  detectable in upper and lower respiratory sp ecimens during the acute  phase of infection. The expected result is Negative. Fact Sheet for Patients:  StrictlyIdeas.no Fact Sheet for Healthcare Providers: BankingDealers.co.za This test is not yet approved or cleared by the [place] FDA and has been authorized for detection and/or diagnosis of SARS-CoV-2 by FDA under an Emergency Use Authorization (EUA).  This EUA will remain in effect (meaning this test can be used) for the duration of the COVID-19 declaration under Section 564(b)(1) of the Act, 21 U.S.C. section 360bbb-3(b)(1), unless the authorization is terminated or revoked sooner. Performed at [facility], [place] [address], [place], [place] [address]       Studies: Ct Image Guided Drainage By Percutaneous Catheter  Result Date: 12/06/2018 CLINICAL DATA:  Empyema, post drainage. CT demonstrates complex right retroperitoneal/perinephric collection. MR suggests perinephric abscess. Drainage requested. EXAM: CT GUIDED DRAINAGE OF RIGHT RETROPERITONEAL ABSCESS ANESTHESIA/SEDATION: Intravenous Fentanyl 110mcg and Versed 2mg  were  administered as conscious sedation during continuous monitoring of the patient's level of consciousness and physiological / cardiorespiratory status by the radiology RN, with a total moderate sedation time of 24 minutes. PROCEDURE: The procedure, risks, benefits, and alternatives were explained to the patient. Questions regarding the procedure were encouraged and answered. The patient understands and consents to the  procedure. Patient placed prone. Select axial scans through the upper abdomen obtained. The retroperitoneal collection was localized, and an appropriate skin entry site was determined and marked. The operative field was prepped with chlorhexidinein a sterile fashion, and a sterile drape was applied covering the operative field. A sterile gown and sterile gloves were used for the procedure. Local anesthesia was provided with 1% Lidocaine. Under CT fluoroscopic guidance, a 19 gauge percutaneous entry needle advanced into the collection. Purulent material could be aspirated. An Amplatz guidewire advanced easily within the collection, its position confirmed on CT. Tract was dilated to facilitate placement of a 12 French pigtail drain catheter, formed centrally within the collection. CT confirmed appropriate positioning. 20 mL of purulent aspirate sent for Gram stain and culture. The catheter was secured externally with 0 Prolene suture and StatLock and placed to gravity drain bag. The patient tolerated the procedure well. COMPLICATIONS: None immediate FINDINGS: Complex right retroperitoneal collection posterior to the kidney was localized. Purulent material was aspirated. 12 French drain catheter placed as above. Sample of the aspirate sent for Gram stain and culture. IMPRESSION: 1. Technically successful CT-guided right retroperitoneal drain catheter placement. Electronically Signed   By: Lucrezia Europe M.D.   On: 12/06/2018 10:38    Scheduled Meds:  amLODipine  10 mg Oral Daily   [START ON 12/07/2018] enoxaparin (LOVENOX) injection  40 mg Subcutaneous Q24H   ferrous sulfate  325 mg Oral BID WC   levothyroxine  75 mcg Oral QAC breakfast   multivitamin with minerals  1 tablet Oral Daily   sodium chloride flush  3 mL Intravenous Once   sodium chloride flush  5 mL Intracatheter Q8H    Continuous Infusions:  piperacillin-tazobactam (ZOSYN)  IV 3.375 g (12/06/18 1034)   vancomycin 750 mg (12/06/18 0436)      LOS: 3 days     Kayleen Memos, MD [facility] Pager [phone]  If 7PM-7AM, please contact night-coverage [email or web address] Password [email or web address] 12/06/2018, 2:57 PM

## 2018-12-06 NOTE — Evaluation (Signed)
Physical Therapy Evaluation & Discharge Patient Details Name: Kevin [name] MRN: [id number] DOB: [birth year] Today's Date: 12/06/2018   History of Present Illness  Kevin Watrous Lundeenis a 63 y.o.malewith medical history significant ofhead and neck cancer status post radiation, hypertension, hypothyroidism, pulmonary nodule presenting to the hospital for evaluation of cough, abnormal labs, and abnormal chest x-ray. Chest CT showing a large loculated gas and fluid collection within the right lateral and posterior thorax abutting the pleural surface, consistent with a large empyema. Pt is now s/p chest tube placement and drain for kidney abscess.    Clinical Impression  Pt presented supine in bed with HOB elevated, awake and willing to participate in therapy session. Prior to admission, pt reported that he was independent with all functional mobility and ADLs. Pt stated that he is very active (runs/walks 4 miles a day). At the time of evaluation, pt independent with all mobility as well as managing his own lines and tubes to navigate around in his room. Pt demonstrated excellent balance and feels that he remains at his baseline in regards to mobility. No further acute PT needs identified at this time. PT signing off.     Follow Up Recommendations No PT follow up    Equipment Recommendations  None recommended by PT    Recommendations for Other Services       Precautions / Restrictions Precautions Precautions: Other (comment) Precaution Comments: chest tube and R trunk drain Restrictions Weight Bearing Restrictions: No      Mobility  Bed Mobility Overal bed mobility: Independent                Transfers Overall transfer level: Independent                  Ambulation/Gait Ambulation/Gait assistance: Independent     Gait Pattern/deviations: WFL(Within Functional Limits)     General Gait Details: pt able to ambulate within his room while managing all of his lines  and tubes independently  Stairs            Wheelchair Mobility    Modified Rankin (Stroke Patients Only)       Balance Overall balance assessment: Independent                                           Pertinent Vitals/Pain Pain Assessment: No/denies pain    Home Living Family/patient expects to be discharged to:: Private residence Living Arrangements: Spouse/significant other;Children Available Help at Discharge: Family;Available PRN/intermittently Type of Home: House Home Access: Stairs to enter   Entrance Stairs-Number of Steps: 5 Home Layout: Two level Home Equipment: None      Prior Function Level of Independence: Independent         Comments: Pt was driving, walking 31miles, and reports he was very active     Hand Dominance   Dominant Hand: Right    Extremity/Trunk Assessment   Upper Extremity Assessment Upper Extremity Assessment: Overall WFL for tasks assessed    Lower Extremity Assessment Lower Extremity Assessment: Overall WFL for tasks assessed       Communication   Communication: No difficulties  Cognition Arousal/Alertness: Awake/alert Behavior During Therapy: WFL for tasks assessed/performed Overall Cognitive Status: Within Functional Limits for tasks assessed  General Comments      Exercises     Assessment/Plan    PT Assessment Patent does not need any further PT services  PT Problem List         PT Treatment Interventions      PT Goals (Current goals can be found in the Care Plan section)  Acute Rehab PT Goals Patient Stated Goal: to go home PT Goal Formulation: All assessment and education complete, DC therapy    Frequency     Barriers to discharge        Co-evaluation               AM-PAC PT "6 Clicks" Mobility  Outcome Measure Help needed turning from your back to your side while in a flat bed without using bedrails?:  None Help needed moving from lying on your back to sitting on the side of a flat bed without using bedrails?: None Help needed moving to and from a bed to a chair (including a wheelchair)?: None Help needed standing up from a chair using your arms (e.g., wheelchair or bedside chair)?: None Help needed to walk in hospital room?: None Help needed climbing 3-5 steps with a railing? : None 6 Click Score: 24    End of Session   Activity Tolerance: Patient tolerated treatment well Patient left: in bed;with call bell/phone within reach Nurse Communication: Mobility status PT Visit Diagnosis: Other abnormalities of gait and mobility (R26.89)    Time: 1007-1219 PT Time Calculation (min) (ACUTE ONLY): 24 min   Charges:   PT Evaluation $PT Eval Moderate Complexity: 1 Mod PT Treatments $Self Care/Home Management: 8-22        Sherie Don, PT, DPT  Acute Rehabilitation Services Pager [phone] Office [place] 12/06/2018, 3:03 PM

## 2018-12-07 LAB — BASIC METABOLIC PANEL
Anion gap: 9 (ref 5–15)
BUN: 10 mg/dL (ref 8–23)
CO2: 25 mmol/L (ref 22–32)
Calcium: 8.7 mg/dL — ABNORMAL LOW (ref 8.9–10.3)
Chloride: 101 mmol/L (ref 98–111)
Creatinine, Ser: 1.14 mg/dL (ref 0.61–1.24)
GFR calc Af Amer: >60 mL/min (ref >60)
GFR calc non Af Amer: >60 mL/min (ref >60)
Glucose, Bld: 90 mg/dL (ref 70–99)
Potassium: 4.5 mmol/L (ref 3.5–5.1)
Sodium: 135 mmol/L (ref 135–145)

## 2018-12-07 LAB — CBC
HCT: 30.6 % — ABNORMAL LOW (ref 39.0–52.0)
Hemoglobin: 10 g/dL — ABNORMAL LOW (ref 13.0–17.0)
MCH: 26.9 pg (ref 26.0–34.0)
MCHC: 32.7 g/dL (ref 30.0–36.0)
MCV: 82.3 fL (ref 80.0–100.0)
Platelets: 662 10*3/uL — ABNORMAL HIGH (ref 150–400)
RBC: 3.72 MIL/uL — ABNORMAL LOW (ref 4.22–5.81)
RDW: 14.7 % (ref 11.5–15.5)
WBC: 13.8 10*3/uL — ABNORMAL HIGH (ref 4.0–10.5)
nRBC: 0 % (ref 0.0–0.2)

## 2018-12-07 MED ORDER — VANCOMYCIN HCL IN DEXTROSE 1-5 GM/200ML-% IV SOLN
1000.0000 mg | INTRAVENOUS | Status: DC
  Administered 2018-12-07: 16:00:00 1000 mg via INTRAVENOUS
  Filled 2018-12-07 (×2): qty 200

## 2018-12-07 NOTE — Progress Notes (Addendum)
Pharmacy Antibiotic Note  Kevin [name] is a 63 y.o. male admitted on 12/02/2018 with empyema.  Pharmacy has been consulted for vancomycin and Zosyn dosing.  Patient s/p chest tube placement per IR 7/30. WBC have improved from 18.9 to 13.8. His Creatinine is trending up and is 1.14 today. Patient is afebrile (but taking Tylenol). Cx of abscess growing staph aureus.   Plan: Decrease vancomycin to 1000 mg IV every 24 hours.   Goal AUC 400-550.  Expected AUC: 439  SCr used: 1.14 Recommend discontinuing Zosyn  Height: 5\' 6"  (167.6 cm) Weight: 132 lb 11.5 oz (60.2 kg) IBW/kg (Calculated) : 63.8  Temp (24hrs), Avg:97.9 F (36.6 C), Min:97.7 F (36.5 C), Max:98.1 F (36.7 C)  Recent Labs  Lab 12/02/18 1645 12/02/18 1800 12/03/18 0336 12/05/18 0812 12/06/18 0649 12/07/18 0337  WBC 18.9*  --  17.9* 17.1* 17.1* 13.8*  CREATININE  --  0.91 0.83 0.95 1.06 1.14  LATICACIDVEN 1.0  --   --   --   --   --     Estimated Creatinine Clearance: 57.9 mL/min (by C-G formula based on SCr of 1.14 mg/dL).    No Known Allergies  Antimicrobials this admission: Cefepime x 1 on 7/29 Zosyn 7/29 >>  vanco 7/29 >>   Dose adjustments this admission: Vancomycin 1000 mg q24h to 750 mg q12h.   Microbiology results: 7/28 BCx: ngtd 8/1 abscess Cx: moderate staphylococcus aureus growth   Thank you for allowing pharmacy to be a part of this patient's care.  Eddie Candle, PharmD PGY-1 Pharmacy Resident  Phone: [phone] 12/07/2018 10:42 AM

## 2018-12-07 NOTE — Progress Notes (Signed)
PROGRESS NOTE  Kevin [name]:[id number] DOB: 05-04-1956 DOA: 12/02/2018 PCP: Berkley Harvey, NP  HPI/Recap of past 24 hours: Kevin Appl Lundeenis a 63 y.o.malewith medical history significant ofhead and neck cancer status post radiation, hypertension, hypothyroidism, pulmonary nodule presenting to the hospital for evaluation of cough, abnormal labs, and abnormal chest x-ray. Patient states he has been coughing for the past 2 days. He previously had slight discomfort in 1 of his ribs on the right side. Denies any left-sided or substernal chest pain/pressure. Denies any injuries to his ribs. States he went to his PCPs office yesterday and was told his white blood cell count was high on labs. He was also told that his chest x-ray showed an infection. He was advised to come into the ED for further evaluation. Denies any fevers or chills. Reports feeling slightly short of breath with exertion for the past few days. Denies any nausea, vomiting, abdominal pain, or diarrhea. No other complaints.  Chest CT showing a large loculated gas and fluid collection within the right lateral and posterior thorax abutting the pleural surface, consistent with a large empyema measuring approximately 10 x 5.3 x 14.9 cm and is contiguous with a smaller loculated fluid collection at the right lung base. Patient received vancomycin, cefepime, and 1 L IV fluid bolus in the ED.  Interventional radiology consulted for CT-guided chest tube placement done on 12/04/18.    Interventional radiology consulted for right perinephric abscess drainage. Completed on 12/06/18.  12/07/18: Patient seen and examined at bedside, resting comfortably in bedside chair.    Chest tube continues with significant output.  Wants to know what timeline he is looking at for discharge purposes.  No other specific complaints at this time.  Denies headache, no chest pain, no palpitations, no shortness of breath, no nausea or vomiting/diarrhea, no  fever/chills/night sweats, no abdominal pain, no weakness.  No acute events overnight per nurse staff.  Assessment/Plan: Principal Problem:   Empyema (HCC) Active Problems:   Hypothyroid   Pulmonary nodule   Anemia   Hyponatremia  Large R empyema  Presenting from his PCP office for leukocytosis with complaints of right-sided rib pain and persistent cough.  Patient was found to have a large right-sided empyema.  Patient underwent chest tube insertion by IR on 12/04/2018.  No malignancy noted on cytology.  Etiology likely secondary to right perinephric abscess. --WBC 17.1-->13.8 --Continue chest tube to low wall suction, management per IR/CT surgery --Continue broad-spectrum antibiotics with vancomycin/Zosyn; pharmacy consulted for dosing/monitoring --Continue to monitor oxygen status, maintain SPO2 greater than 92%; currently on room air --CT surgery plans for right VATS once right perinephric abscess has been drained  Right Perinephric abscess  CT abdomen/pelvis on 12/03/2018 notable for right posterior perinephric space abscess measuring 7.7 x 5.8 x 8.3 cm which is also appreciated on MRI abdomen/pelvis.  Underwent CT-guided right drain placement on 12/06/2018 by IR. --Leukocytosis improving, WBC 17.1-->13.8 --Continue vancomycin/Zosyn as above --Continue monitor fever curve and daily CBC  Thrombocythemia Platelets on presentation greater than 600K, could be reactive --Continue Lovenox DVT prophylaxis  Hx of oropharyngeal cancer --Follow up outpatient with oncology  Severe protein calorie malnutrition Albumin 1.9, BMI 21 --Dietitian consult  Physical debility PT OT.  No PT follow-up recommended by PT   DVT prophylaxis:  Subcu Lovenox daily Code Status:  Full code Family Communication:  None Disposition Plan:  Discharge to home when cardiothoracic surgery signs off. Consults called: IR, cardiothoracic surgery   Objective: Vitals:   12/06/18  2130 12/07/18 0654 12/07/18  0732 12/07/18 1320  BP: 121/72 117/76 119/77 112/74  Pulse: 76 71 66 76  Resp: 20 16  19   Temp: 98.1 F (36.7 C) 97.9 F (36.6 C)  98.6 F (37 C)  TempSrc: Oral   Oral  SpO2: 97% 97%  98%  Weight:      Height:        Intake/Output Summary (Last 24 hours) at 12/07/2018 1821 Last data filed at 12/07/2018 1744 Gross per 24 hour  Intake 852.25 ml  Output 93 ml  Net 759.25 ml   Filed Weights   12/03/18 0215 12/03/18 1659  Weight: 59 kg 60.2 kg    Exam:  . General: 63 y.o. year-old male thin built in no acute distress.  Alert and oriented x4.   . Cardiovascular: Regular rate and rhythm no rubs or gallops.  No JVD or thyromegaly noted. Marland Kitchen Respiratory: No wheezes or rales.  Poor expiratory effort.  Site of chest tube with no erythema or edema. . Abdomen: Soft nontender nondistended normal bowel sounds present.  Noted right-sided drain . Musculoskeletal: Trace lower extremity edema bilaterally right greater than left.  2 out of 4 pulses in all 4 extremities. Marland Kitchen Psychiatry: Mood is appropriate for condition and setting.   Data Reviewed: CBC: Recent Labs  Lab 12/02/18 1645 12/03/18 0336 12/05/18 0812 12/06/18 0649 12/07/18 0337  WBC 18.9* 17.9* 17.1* 17.1* 13.8*  NEUTROABS 16.3*  --  14.7*  --   --   HGB 9.4* 10.4* 10.3* 10.7* 10.0*  HCT 28.3* 31.8* 31.0* 32.3* 30.6*  MCV 81.6 83.0 81.4 81.0 82.3  PLT 601* 627* 602* 666* 144*   Basic Metabolic Panel: Recent Labs  Lab 12/02/18 1800 12/03/18 0336 12/05/18 0812 12/06/18 0649 12/07/18 0337  NA 129* 134* 135 134* 135  K 3.1* 3.1* 4.0 3.5 4.5  CL 97* 100 102 101 101  CO2 21* 24 25 26 25   GLUCOSE 136* 104* 105* 102* 90  BUN 10 6* 10 11 10   CREATININE 0.91 0.83 0.95 1.06 1.14  CALCIUM 8.1* 8.3* 8.5* 8.4* 8.7*  MG  --  1.8 2.1  --   --   PHOS  --   --  3.5  --   --    GFR: Estimated Creatinine Clearance: 57.9 mL/min (by C-G formula based on SCr of 1.14 mg/dL). Liver Function Tests: Recent Labs  Lab 12/02/18 1800  12/05/18 0812  AST 21 16  ALT 17 18  ALKPHOS 136* 122  BILITOT 0.3 0.5  PROT 6.3* 6.3*  ALBUMIN 2.0* 1.9*   No results for input(s): LIPASE, AMYLASE in the last 168 hours. No results for input(s): AMMONIA in the last 168 hours. Coagulation Profile: Recent Labs  Lab 12/04/18 0316  INR 1.3*   Cardiac Enzymes: No results for input(s): CKTOTAL, CKMB, CKMBINDEX, TROPONINI in the last 168 hours. BNP (last 3 results) No results for input(s): PROBNP in the last 8760 hours. HbA1C: No results for input(s): HGBA1C in the last 72 hours. CBG: No results for input(s): GLUCAP in the last 168 hours. Lipid Profile: No results for input(s): CHOL, HDL, LDLCALC, TRIG, CHOLHDL, LDLDIRECT in the last 72 hours. Thyroid Function Tests: No results for input(s): TSH, T4TOTAL, FREET4, T3FREE, THYROIDAB in the last 72 hours. Anemia Panel: No results for input(s): VITAMINB12, FOLATE, FERRITIN, TIBC, IRON, RETICCTPCT in the last 72 hours. Urine analysis:    Component Value Date/Time   COLORURINE STRAW (A) 12/03/2018 [place] 12/03/2018 0343  LABSPEC 1.011 12/03/2018 0343   PHURINE 7.0 12/03/2018 0343   GLUCOSEU NEGATIVE 12/03/2018 0343   HGBUR NEGATIVE 12/03/2018 0343   BILIRUBINUR NEGATIVE 12/03/2018 0343   KETONESUR NEGATIVE 12/03/2018 0343   PROTEINUR NEGATIVE 12/03/2018 0343   NITRITE NEGATIVE 12/03/2018 0343   LEUKOCYTESUR NEGATIVE 12/03/2018 0343   Sepsis Labs: @LABRCNTIP (procalcitonin:4,lacticidven:4)  ) Recent Results (from the past 240 hour(s))  Blood culture (routine x 2)     Status: None (Preliminary result)   Collection Time: 12/02/18 11:29 PM   Specimen: BLOOD  Result Value Ref Range Status   Specimen Description BLOOD RIGHT ANTECUBITAL  Final   Special Requests   Final    BOTTLES DRAWN AEROBIC AND ANAEROBIC Blood Culture results may not be optimal due to an excessive volume of blood received in culture bottles   Culture   Final    NO GROWTH 4 DAYS  Performed at [facility], [place] [address] [place], [place] [address]    Report Status PENDING  Incomplete  Blood culture (routine x 2)     Status: None (Preliminary result)   Collection Time: 12/02/18 11:38 PM   Specimen: BLOOD  Result Value Ref Range Status   Specimen Description BLOOD LEFT ANTECUBITAL  Final   Special Requests   Final    BOTTLES DRAWN AEROBIC AND ANAEROBIC Blood Culture results may not be optimal due to an excessive volume of blood received in culture bottles   Culture   Final    NO GROWTH 4 DAYS Performed at [facility], [place] [address] [place], [place] [address]    Report Status PENDING  Incomplete  SARS Coronavirus 2 (CEPHEID- Performed in [place] hospital lab), Hosp Order     Status: None   Collection Time: 12/03/18  1:04 AM   Specimen: Nasopharyngeal Swab  Result Value Ref Range Status   SARS Coronavirus 2 NEGATIVE NEGATIVE Final    Comment: (NOTE) If result is NEGATIVE SARS-CoV-2 target nucleic acids are NOT DETECTED. The SARS-CoV-2 RNA is generally detectable in upper and lower  respiratory specimens during the acute phase of infection. The lowest  concentration of SARS-CoV-2 viral copies this assay can detect is 250  copies / mL. A negative result does not preclude SARS-CoV-2 infection  and should not be used as the sole basis for treatment or other  patient management decisions.  A negative result may occur with  improper specimen collection / handling, submission of specimen other  than nasopharyngeal swab, presence of viral mutation(s) within the  areas targeted by this assay, and inadequate number of viral copies  (<250 copies / mL). A negative result must be combined with clinical  observations, patient history, and epidemiological information. If result is POSITIVE SARS-CoV-2 target nucleic acids are DETECTED. The SARS-CoV-2 RNA is generally detectable in upper and lower  respiratory specimens dur ing the acute phase of  infection.  Positive  results are indicative of active infection with SARS-CoV-2.  Clinical  correlation with patient history and other diagnostic information is  necessary to determine patient infection status.  Positive results do  not rule out bacterial infection or co-infection with other viruses. If result is PRESUMPTIVE POSTIVE SARS-CoV-2 nucleic acids MAY BE PRESENT.   A presumptive positive result was obtained on the submitted specimen  and confirmed on repeat testing.  While 2019 novel coronavirus  (SARS-CoV-2) nucleic acids may be present in the submitted sample  additional confirmatory testing may be necessary for epidemiological  and / or clinical  management purposes  to differentiate between  SARS-CoV-2 and other Sarbecovirus currently known to infect humans.  If clinically indicated additional testing with an alternate test  methodology [id number]) is advised. The SARS-CoV-2 RNA is generally  detectable in upper and lower respiratory sp ecimens during the acute  phase of infection. The expected result is Negative. Fact Sheet for Patients:  StrictlyIdeas.no Fact Sheet for Healthcare Providers: BankingDealers.co.za This test is not yet approved or cleared by the [place] FDA and has been authorized for detection and/or diagnosis of SARS-CoV-2 by FDA under an Emergency Use Authorization (EUA).  This EUA will remain in effect (meaning this test can be used) for the duration of the COVID-19 declaration under Section 564(b)(1) of the Act, 21 U.S.C. section 360bbb-3(b)(1), unless the authorization is terminated or revoked sooner. Performed at [facility], [place] [address] [place], [place] [address]   Aerobic/Anaerobic Culture (surgical/deep wound)     Status: None (Preliminary result)   Collection Time: 12/06/18 10:00 AM   Specimen: Abscess  Result Value Ref Range Status   Specimen Description ABSCESS  Final   Special  Requests NONE  Final   Gram Stain   Final    ABUNDANT WBC PRESENT, PREDOMINANTLY PMN MODERATE GRAM POSITIVE COCCI IN PAIRS IN CLUSTERS    Culture   Final    MODERATE STAPHYLOCOCCUS AUREUS SUSCEPTIBILITIES TO FOLLOW Performed at [facility], [place] [address], [place], [place] [address]    Report Status PENDING  Incomplete      Studies: No results found.  Scheduled Meds: . amLODipine  10 mg Oral Daily  . enoxaparin (LOVENOX) injection  40 mg Subcutaneous Q24H  . ferrous sulfate  325 mg Oral BID WC  . levothyroxine  75 mcg Oral QAC breakfast  . multivitamin with minerals  1 tablet Oral Daily  . sodium chloride flush  3 mL Intravenous Once  . sodium chloride flush  5 mL Intracatheter Q8H    Continuous Infusions: . piperacillin-tazobactam (ZOSYN)  IV 3.375 g (12/07/18 1611)  . vancomycin 1,000 mg (12/07/18 1612)     LOS: 4 days     Tianah Lonardo J [place] DO [facility] Pager [phone]  If 7PM-7AM, please contact night-coverage [email or web address] Password [facility] 12/07/2018, 6:21 PM

## 2018-12-08 DIAGNOSIS — J869 Pyothorax without fistula: Secondary | ICD-10-CM

## 2018-12-08 LAB — CBC
HCT: 31.2 % — ABNORMAL LOW (ref 39.0–52.0)
Hemoglobin: 10.1 g/dL — ABNORMAL LOW (ref 13.0–17.0)
MCH: 26.4 pg (ref 26.0–34.0)
MCHC: 32.4 g/dL (ref 30.0–36.0)
MCV: 81.7 fL (ref 80.0–100.0)
Platelets: 628 10*3/uL — ABNORMAL HIGH (ref 150–400)
RBC: 3.82 MIL/uL — ABNORMAL LOW (ref 4.22–5.81)
RDW: 14.7 % (ref 11.5–15.5)
WBC: 11.5 10*3/uL — ABNORMAL HIGH (ref 4.0–10.5)
nRBC: 0 % (ref 0.0–0.2)

## 2018-12-08 LAB — CULTURE, BLOOD (ROUTINE X 2)
Culture: NO GROWTH
Culture: NO GROWTH

## 2018-12-08 LAB — BASIC METABOLIC PANEL
Anion gap: 9 (ref 5–15)
BUN: 11 mg/dL (ref 8–23)
CO2: 26 mmol/L (ref 22–32)
Calcium: 8.7 mg/dL — ABNORMAL LOW (ref 8.9–10.3)
Chloride: 100 mmol/L (ref 98–111)
Creatinine, Ser: 1.22 mg/dL (ref 0.61–1.24)
GFR calc Af Amer: >60 mL/min (ref >60)
GFR calc non Af Amer: >60 mL/min (ref >60)
Glucose, Bld: 93 mg/dL (ref 70–99)
Potassium: 4.6 mmol/L (ref 3.5–5.1)
Sodium: 135 mmol/L (ref 135–145)

## 2018-12-08 MED ORDER — CEFAZOLIN SODIUM-DEXTROSE 2-4 GM/100ML-% IV SOLN
2.0000 g | Freq: Three times a day (TID) | INTRAVENOUS | Status: DC
  Administered 2018-12-08 – 2018-12-13 (×15): 2 g via INTRAVENOUS
  Filled 2018-12-08 (×16): qty 100

## 2018-12-08 MED ORDER — MUPIROCIN 2 % EX OINT
1.0000 "application " | TOPICAL_OINTMENT | Freq: Two times a day (BID) | CUTANEOUS | Status: AC
  Administered 2018-12-08 – 2018-12-12 (×6): 1 via NASAL
  Filled 2018-12-08 (×2): qty 22

## 2018-12-08 MED ORDER — CHLORHEXIDINE GLUCONATE CLOTH 2 % EX PADS
6.0000 | MEDICATED_PAD | Freq: Every day | CUTANEOUS | Status: AC
  Administered 2018-12-08 – 2018-12-12 (×5): 6 via TOPICAL

## 2018-12-08 NOTE — Progress Notes (Addendum)
      [address]       [place] [address]             [phone]           Subjective: Patient states breathing is much better and he is feeling better since "kidney drain" placed.  Objective: Vital signs in last 24 hours: Temp:  [97.7 F (36.5 C)-98.6 F (37 C)] 97.7 F (36.5 C) (08/03 0514) Pulse Rate:  [70-76] 72 (08/03 0514) Cardiac Rhythm: Normal sinus rhythm (08/03 0717) Resp:  [16-19] 18 (08/03 0514) BP: (112-122)/(72-75) 122/72 (08/03 0514) SpO2:  [98 %-100 %] 100 % (08/03 0514)      Intake/Output from previous day: 08/02 0701 - 08/03 0700 In: 852.3 [P.O.:480; IV Piggyback:367.3] Out: 58 [Drains:58]   Physical Exam:  Cardiovascular: RRR Pulmonary: Clear to auscultation on left and slightly diminished right basilar breath sounds Wounds: Dressing is clean and dry.  Chest Tube: to suction, no air leak  Lab Results: CBC: Recent Labs    12/07/18 0337 12/08/18 0207  WBC 13.8* 11.5*  HGB 10.0* 10.1*  HCT 30.6* 31.2*  PLT 662* 628*   BMET:  Recent Labs    12/07/18 0337 12/08/18 0207  NA 135 135  K 4.5 4.6  CL 101 100  CO2 25 26  GLUCOSE 90 93  BUN 10 11  CREATININE 1.14 1.22  CALCIUM 8.7* 8.7*    PT/INR: No results for input(s): LABPROT, INR in the last 72 hours. ABG:  INR: Will add last result for INR, ABG once components are confirmed Will add last 4 CBG results once components are confirmed  Assessment/Plan:  1. CV - SR in the 60-70's 2.  Pulmonary -Loculated right pleural effusion. S/p 20 French right pigtail chest tube by IR. Chest tube is to suction, no air leak. Scant output. Will need eventual right VATS-Dr. Kipp Brood to determine timing. 3. History of head and neck cancer-s/p radiation 4. ID-on Vancomycin and Zosyn for empyema 5. Right perinephric abscess-s/p CT drainage 08/01 by IR. 58 cc recorded output. 6. Anemia- H and H this am stable at 10.1 and 31.2  Donielle M ZimmermanPA-C 12/08/2018,8:14 AM  [phone]  Doing well Will plan for VATS decort this admission after cross-sectional imaging shows improvement of the retroperitoneal abscess.  Josey Forcier Bary Leriche

## 2018-12-08 NOTE — Progress Notes (Signed)
PROGRESS NOTE  QUINTERRIUS ERRINGTON RAQ:[id number] DOB: 08/31/1955 DOA: 12/02/2018 PCP: Berkley Harvey, NP  HPI/Recap of past 24 hours: Kevin [name] a 63 y.o.malewith medical history significant ofhead and neck cancer status post radiation, hypertension, hypothyroidism, pulmonary nodule presenting to the hospital for evaluation of cough, abnormal labs, and abnormal chest x-ray. Patient states he has been coughing for the past 2 days. He previously had slight discomfort in 1 of his ribs on the right side. Denies any left-sided or substernal chest pain/pressure. Denies any injuries to his ribs. States he went to his PCPs office yesterday and was told his white blood cell count was high on labs. He was also told that his chest x-ray showed an infection. He was advised to come into the ED for further evaluation. Denies any fevers or chills. Reports feeling slightly short of breath with exertion for the past few days. Denies any nausea, vomiting, abdominal pain, or diarrhea. No other complaints.  Chest CT showing a large loculated gas and fluid collection within the right lateral and posterior thorax abutting the pleural surface, consistent with a large empyema measuring approximately 10 x 5.3 x 14.9 cm and is contiguous with a smaller loculated fluid collection at the right lung base. Patient received vancomycin, cefepime, and 1 L IV fluid bolus in the ED.  Interventional radiology consulted for CT-guided chest tube placement done on 12/04/18.    Interventional radiology consulted for right perinephric abscess drainage. Completed on 12/06/18.  12/08/18: Patient seen and examined at bedside, resting comfortably, no complaints.  Continues with right-sided drain and chest tube in place.  Denies headache, no fevers no chills/night sweats, no nauseous vomiting/diarrhea, no chest pain, no palpitations, no abdominal pain.  No acute events overnight per nursing staff.  Assessment/Plan: Principal  Problem:   Empyema (HCC) Active Problems:   Hypothyroid   Pulmonary nodule   Anemia   Hyponatremia  Large R empyema  Presenting from his PCP office for leukocytosis with complaints of right-sided rib pain and persistent cough.  Patient was found to have a large right-sided empyema.  Patient underwent chest tube insertion by IR on 12/04/2018.  No malignancy noted on cytology.  Etiology likely secondary to right perinephric abscess. --WBC 17.1-->13.8-->11.5 --Continue chest tube to low wall suction, management per IR/CT surgery --Continue broad-spectrum antibiotics with vancomycin/Zosyn; pharmacy consulted for dosing/monitoring --Continue to monitor oxygen status, maintain SPO2 greater than 92%; currently on room air --CT surgery plans for right VATS decort once right perinephric abscess has been drained; will need interval imaging for surveillance; will defer to IR/CTS for timing  Right Perinephric abscess  CT abdomen/pelvis on 12/03/2018 notable for right posterior perinephric space abscess measuring 7.7 x 5.8 x 8.3 cm which is also appreciated on MRI abdomen/pelvis.  Underwent CT-guided right drain placement on 12/06/2018 by IR. --Leukocytosis improving, WBC 17.1-->13.8-->11.5 --Continues with purulent output, 50 mL's past 24 hours --Anaerobic culture with GPC's --Continue vancomycin/Zosyn as above --Continue monitor fever curve and daily CBC  Thrombocythemia Platelets on presentation greater than 600K, could be reactive --Continue Lovenox DVT prophylaxis  Hx of oropharyngeal cancer --Follow up outpatient with oncology  Severe protein calorie malnutrition Albumin 1.9, BMI 21 --Dietitian consult  Physical debility PT OT.  No PT follow-up recommended by PT   DVT prophylaxis:  Subcu Lovenox daily Code Status:  Full code Family Communication:  None Disposition Plan:  Discharge to home when cardiothoracic surgery signs off. Consults called: IR, cardiothoracic surgery    Objective: Vitals:  12/07/18 0732 12/07/18 1320 12/07/18 2059 12/08/18 0514  BP: 119/77 112/74 113/75 122/72  Pulse: 66 76 70 72  Resp:  19 16 18   Temp:  98.6 F (37 C) 98.3 F (36.8 C) 97.7 F (36.5 C)  TempSrc:  Oral Oral Oral  SpO2:  98% 98% 100%  Weight:      Height:        Intake/Output Summary (Last 24 hours) at 12/08/2018 1416 Last data filed at 12/08/2018 1318 Gross per 24 hour  Intake 612.25 ml  Output 8 ml  Net 604.25 ml   Filed Weights   12/03/18 0215 12/03/18 1659  Weight: 59 kg 60.2 kg    Exam:  . General: 63 y.o. year-old male thin built in no acute distress.  Alert and oriented x4.   . Cardiovascular: Regular rate and rhythm no rubs or gallops.  No JVD or thyromegaly noted.   Marland Kitchen Respiratory: No wheezes or rales.  Poor expiratory effort.  Site of chest tube with no erythema or edema. . Abdomen: Soft nontender nondistended normal bowel sounds present.  Noted right-sided drain . Musculoskeletal: Trace lower extremity edema bilaterally right greater than left.  2 out of 4 pulses in all 4 extremities. Marland Kitchen Psychiatry: Mood is appropriate for condition and setting.   Data Reviewed: CBC: Recent Labs  Lab 12/02/18 1645 12/03/18 0336 12/05/18 0932 12/06/18 0649 12/07/18 0337 12/08/18 0207  WBC 18.9* 17.9* 17.1* 17.1* 13.8* 11.5*  NEUTROABS 16.3*  --  14.7*  --   --   --   HGB 9.4* 10.4* 10.3* 10.7* 10.0* 10.1*  HCT 28.3* 31.8* 31.0* 32.3* 30.6* 31.2*  MCV 81.6 83.0 81.4 81.0 82.3 81.7  PLT 601* 627* 602* 666* 662* 671*   Basic Metabolic Panel: Recent Labs  Lab 12/03/18 0336 12/05/18 0812 12/06/18 0649 12/07/18 0337 12/08/18 0207  NA 134* 135 134* 135 135  K 3.1* 4.0 3.5 4.5 4.6  CL 100 102 101 101 100  CO2 24 25 26 25 26   GLUCOSE 104* 105* 102* 90 93  BUN 6* 10 11 10 11   CREATININE 0.83 0.95 1.06 1.14 1.22  CALCIUM 8.3* 8.5* 8.4* 8.7* 8.7*  MG 1.8 2.1  --   --   --   PHOS  --  3.5  --   --   --    GFR: Estimated Creatinine Clearance: 54.1  mL/min (by C-G formula based on SCr of 1.22 mg/dL). Liver Function Tests: Recent Labs  Lab 12/02/18 1800 12/05/18 0812  AST 21 16  ALT 17 18  ALKPHOS 136* 122  BILITOT 0.3 0.5  PROT 6.3* 6.3*  ALBUMIN 2.0* 1.9*   No results for input(s): LIPASE, AMYLASE in the last 168 hours. No results for input(s): AMMONIA in the last 168 hours. Coagulation Profile: Recent Labs  Lab 12/04/18 0316  INR 1.3*   Cardiac Enzymes: No results for input(s): CKTOTAL, CKMB, CKMBINDEX, TROPONINI in the last 168 hours. BNP (last 3 results) No results for input(s): PROBNP in the last 8760 hours. HbA1C: No results for input(s): HGBA1C in the last 72 hours. CBG: No results for input(s): GLUCAP in the last 168 hours. Lipid Profile: No results for input(s): CHOL, HDL, LDLCALC, TRIG, CHOLHDL, LDLDIRECT in the last 72 hours. Thyroid Function Tests: No results for input(s): TSH, T4TOTAL, FREET4, T3FREE, THYROIDAB in the last 72 hours. Anemia Panel: No results for input(s): VITAMINB12, FOLATE, FERRITIN, TIBC, IRON, RETICCTPCT in the last 72 hours. Urine analysis:    Component Value Date/Time  [place] (A) 12/03/2018 [place] 12/03/2018 0343   LABSPEC 1.011 12/03/2018 0343   PHURINE 7.0 12/03/2018 0343   GLUCOSEU NEGATIVE 12/03/2018 0343   HGBUR NEGATIVE 12/03/2018 0343   BILIRUBINUR NEGATIVE 12/03/2018 0343   KETONESUR NEGATIVE 12/03/2018 0343   PROTEINUR NEGATIVE 12/03/2018 0343   NITRITE NEGATIVE 12/03/2018 0343   LEUKOCYTESUR NEGATIVE 12/03/2018 0343   Sepsis Labs: @LABRCNTIP (procalcitonin:4,lacticidven:4)  ) Recent Results (from the past 240 hour(s))  Blood culture (routine x 2)     Status: None   Collection Time: 12/02/18 11:29 PM   Specimen: BLOOD  Result Value Ref Range Status   Specimen Description BLOOD RIGHT ANTECUBITAL  Final   Special Requests   Final    BOTTLES DRAWN AEROBIC AND ANAEROBIC Blood Culture results may not be optimal due to an excessive  volume of blood received in culture bottles   Culture   Final    NO GROWTH 5 DAYS Performed at [facility], [place] [address], [place], [place] [address]    Report Status 12/08/2018 FINAL  Final  Blood culture (routine x 2)     Status: None   Collection Time: 12/02/18 11:38 PM   Specimen: BLOOD  Result Value Ref Range Status   Specimen Description BLOOD LEFT ANTECUBITAL  Final   Special Requests   Final    BOTTLES DRAWN AEROBIC AND ANAEROBIC Blood Culture results may not be optimal due to an excessive volume of blood received in culture bottles   Culture   Final    NO GROWTH 5 DAYS Performed at [facility], [place] [address], [place], [place] [address]    Report Status 12/08/2018 FINAL  Final  SARS Coronavirus 2 (CEPHEID- Performed in [place] hospital lab), Hosp Order     Status: None   Collection Time: 12/03/18  1:04 AM   Specimen: Nasopharyngeal Swab  Result Value Ref Range Status   SARS Coronavirus 2 NEGATIVE NEGATIVE Final    Comment: (NOTE) If result is NEGATIVE SARS-CoV-2 target nucleic acids are NOT DETECTED. The SARS-CoV-2 RNA is generally detectable in upper and lower  respiratory specimens during the acute phase of infection. The lowest  concentration of SARS-CoV-2 viral copies this assay can detect is 250  copies / mL. A negative result does not preclude SARS-CoV-2 infection  and should not be used as the sole basis for treatment or other  patient management decisions.  A negative result may occur with  improper specimen collection / handling, submission of specimen other  than nasopharyngeal swab, presence of viral mutation(s) within the  areas targeted by this assay, and inadequate number of viral copies  (<250 copies / mL). A negative result must be combined with clinical  observations, patient history, and epidemiological information. If result is POSITIVE SARS-CoV-2 target nucleic acids are DETECTED. The SARS-CoV-2 RNA is generally detectable in  upper and lower  respiratory specimens dur ing the acute phase of infection.  Positive  results are indicative of active infection with SARS-CoV-2.  Clinical  correlation with patient history and other diagnostic information is  necessary to determine patient infection status.  Positive results do  not rule out bacterial infection or co-infection with other viruses. If result is PRESUMPTIVE POSTIVE SARS-CoV-2 nucleic acids MAY BE PRESENT.   A presumptive positive result was obtained on the submitted specimen  and confirmed on repeat testing.  While 2019 novel coronavirus  (SARS-CoV-2) nucleic acids may be present in the submitted sample  additional confirmatory  testing may be necessary for epidemiological  and / or clinical management purposes  to differentiate between  SARS-CoV-2 and other Sarbecovirus currently known to infect humans.  If clinically indicated additional testing with an alternate test  methodology [phone]) is advised. The SARS-CoV-2 RNA is generally  detectable in upper and lower respiratory sp ecimens during the acute  phase of infection. The expected result is Negative. Fact Sheet for Patients:  StrictlyIdeas.no Fact Sheet for Healthcare Providers: BankingDealers.co.za This test is not yet approved or cleared by the [place] FDA and has been authorized for detection and/or diagnosis of SARS-CoV-2 by FDA under an Emergency Use Authorization (EUA).  This EUA will remain in effect (meaning this test can be used) for the duration of the COVID-19 declaration under Section 564(b)(1) of the Act, 21 U.S.C. section 360bbb-3(b)(1), unless the authorization is terminated or revoked sooner. Performed at [facility], [place] [address] [place], [place] [address]   Aerobic/Anaerobic Culture (surgical/deep wound)     Status: None (Preliminary result)   Collection Time: 12/06/18 10:00 AM   Specimen: Abscess  Result Value  Ref Range Status   Specimen Description ABSCESS  Final   Special Requests NONE  Final   Gram Stain   Final    ABUNDANT WBC PRESENT, PREDOMINANTLY PMN MODERATE GRAM POSITIVE COCCI IN PAIRS IN CLUSTERS    Culture   Final    MODERATE STAPHYLOCOCCUS AUREUS CULTURE REINCUBATED FOR BETTER GROWTH Performed at [facility], [place] [address] [place], [place] [address]    Report Status PENDING  Incomplete   Organism ID, Bacteria STAPHYLOCOCCUS AUREUS  Final      Susceptibility   Staphylococcus aureus - MIC*    CIPROFLOXACIN <=0.5 SENSITIVE Sensitive     ERYTHROMYCIN <=0.25 SENSITIVE Sensitive     GENTAMICIN <=0.5 SENSITIVE Sensitive     OXACILLIN 0.5 SENSITIVE Sensitive     TETRACYCLINE <=1 SENSITIVE Sensitive     VANCOMYCIN <=0.5 SENSITIVE Sensitive     TRIMETH/SULFA <=10 SENSITIVE Sensitive     CLINDAMYCIN <=0.25 SENSITIVE Sensitive     RIFAMPIN <=0.5 SENSITIVE Sensitive     Inducible Clindamycin NEGATIVE Sensitive     * MODERATE STAPHYLOCOCCUS AUREUS      Studies: No results found.  Scheduled Meds: . amLODipine  10 mg Oral Daily  . Chlorhexidine Gluconate Cloth  6 each Topical Daily  . enoxaparin (LOVENOX) injection  40 mg Subcutaneous Q24H  . ferrous sulfate  325 mg Oral BID WC  . levothyroxine  75 mcg Oral QAC breakfast  . multivitamin with minerals  1 tablet Oral Daily  . mupirocin ointment  1 application Nasal BID  . sodium chloride flush  3 mL Intravenous Once  . sodium chloride flush  5 mL Intracatheter Q8H    Continuous Infusions: . piperacillin-tazobactam (ZOSYN)  IV 3.375 g (12/08/18 0936)  . vancomycin 1,000 mg (12/07/18 1612)     LOS: 5 days   Time spent: 37 minutes spent on chart review, discussion with nursing staff, consultants, updating family and interview/physical exam; more than 50% of that time was spent in counseling and/or coordination of care.  Eric J [place] DO [facility] Pager [phone]  If 7PM-7AM, please contact  night-coverage [email or web address] Password [email or web address] 12/08/2018, 2:16 PM

## 2018-12-08 NOTE — Progress Notes (Signed)
Referring Physician(s): Dr. Nevada Crane  Supervising Physician: Corrie Mckusick  Patient Status:  [facility] - In-pt  Chief Complaint: Hydropneumothorax Perinephric abscess  Subjective: Lying in bed.  Chest tube to suction.  Perinephric drain to gravity with purulent output.   Allergies: Patient has no known allergies.  Medications: Prior to Admission medications   Medication Sig Start Date End Date Taking? Authorizing Provider  amLODipine (NORVASC) 10 MG tablet Take 10 mg by mouth daily.  05/22/16  Yes [provider]  levothyroxine (SYNTHROID) 75 MCG tablet Take 75 mcg by mouth daily before breakfast.   Yes [provider]     Vital Signs: BP 122/72 (BP Location: Left Arm)   Pulse 72   Temp 97.7 F (36.5 C) (Oral)   Resp 18   Ht 5\' 6"  (1.676 m)   Wt 132 lb 11.5 oz (60.2 kg)   SpO2 100%   BMI 21.42 kg/m   Physical Exam  NAD, lying in bed Chest:  R chest tube in place. No air leak.  Abdomen: R perinephric in drain.  Bloody, purulent output in collection bag.   Imaging: Ct Image Guided Drainage By Percutaneous Catheter  Result Date: 12/06/2018 CLINICAL DATA:  Empyema, post drainage. CT demonstrates complex right retroperitoneal/perinephric collection. MR suggests perinephric abscess. Drainage requested. EXAM: CT GUIDED DRAINAGE OF RIGHT RETROPERITONEAL ABSCESS ANESTHESIA/SEDATION: Intravenous Fentanyl 171mcg and Versed 2mg  were administered as conscious sedation during continuous monitoring of the patient's level of consciousness and physiological / cardiorespiratory status by the radiology RN, with a total moderate sedation time of 24 minutes. PROCEDURE: The procedure, risks, benefits, and alternatives were explained to the patient. Questions regarding the procedure were encouraged and answered. The patient understands and consents to the procedure. Patient placed prone. Select axial scans through the upper abdomen obtained. The retroperitoneal collection was  localized, and an appropriate skin entry site was determined and marked. The operative field was prepped with chlorhexidinein a sterile fashion, and a sterile drape was applied covering the operative field. A sterile gown and sterile gloves were used for the procedure. Local anesthesia was provided with 1% Lidocaine. Under CT fluoroscopic guidance, a 19 gauge percutaneous entry needle advanced into the collection. Purulent material could be aspirated. An Amplatz guidewire advanced easily within the collection, its position confirmed on CT. Tract was dilated to facilitate placement of a 12 French pigtail drain catheter, formed centrally within the collection. CT confirmed appropriate positioning. 20 mL of purulent aspirate sent for Gram stain and culture. The catheter was secured externally with 0 Prolene suture and StatLock and placed to gravity drain bag. The patient tolerated the procedure well. COMPLICATIONS: None immediate FINDINGS: Complex right retroperitoneal collection posterior to the kidney was localized. Purulent material was aspirated. 12 French drain catheter placed as above. Sample of the aspirate sent for Gram stain and culture. IMPRESSION: 1. Technically successful CT-guided right retroperitoneal drain catheter placement. Electronically Signed   By: Lucrezia Europe M.D.   On: 12/06/2018 10:38    Labs:  CBC: Recent Labs    12/05/18 0812 12/06/18 0649 12/07/18 0337 12/08/18 0207  WBC 17.1* 17.1* 13.8* 11.5*  HGB 10.3* 10.7* 10.0* 10.1*  HCT 31.0* 32.3* 30.6* 31.2*  PLT 602* 666* 662* 628*    COAGS: Recent Labs    12/04/18 0316  INR 1.3*    BMP: Recent Labs    12/05/18 0812 12/06/18 0649 12/07/18 0337 12/08/18 0207  NA 135 134* 135 135  K 4.0 3.5 4.5 4.6  CL 102 101  101 100  CO2 25 26 25 26   GLUCOSE 105* 102* 90 93  BUN 10 11 10 11   CALCIUM 8.5* 8.4* 8.7* 8.7*  CREATININE 0.95 1.06 1.14 1.22  GFRNONAA >60 >60 >60 >60  GFRAA >60 >60 >60 >60    LIVER FUNCTION TESTS:  Recent Labs    12/02/18 1800 12/05/18 0812  BILITOT 0.3 0.5  AST 21 16  ALT 17 18  ALKPHOS 136* 122  PROT 6.3* 6.3*  ALBUMIN 2.0* 1.9*    Assessment and Plan: Large R empyema Chest tube in place.  Pleurvac to suction.  No air leak.   R perinephric abscess Remains in place.  Purulent output in collection bag.  Culture pending however growing GPC in pairs Continue routine care and regular flushes  IR following.   Electronically Signed: Docia Barrier, PA 12/08/2018, 12:37 PM   I spent a total of 15 Minutes at the the patient's bedside AND on the patient's hospital floor or unit, greater than 50% of which was counseling/coordinating care for perinephric abscess, empyema.

## 2018-12-09 ENCOUNTER — Inpatient Hospital Stay (HOSPITAL_COMMUNITY): Payer: BC Managed Care – PPO

## 2018-12-09 LAB — BASIC METABOLIC PANEL
Anion gap: 10 (ref 5–15)
BUN: 13 mg/dL (ref 8–23)
CO2: 25 mmol/L (ref 22–32)
Calcium: 8.6 mg/dL — ABNORMAL LOW (ref 8.9–10.3)
Chloride: 98 mmol/L (ref 98–111)
Creatinine, Ser: 1.1 mg/dL (ref 0.61–1.24)
GFR calc Af Amer: >60 mL/min (ref >60)
GFR calc non Af Amer: >60 mL/min (ref >60)
Glucose, Bld: 97 mg/dL (ref 70–99)
Potassium: 4 mmol/L (ref 3.5–5.1)
Sodium: 133 mmol/L — ABNORMAL LOW (ref 135–145)

## 2018-12-09 LAB — CBC
HCT: 31.3 % — ABNORMAL LOW (ref 39.0–52.0)
Hemoglobin: 10.4 g/dL — ABNORMAL LOW (ref 13.0–17.0)
MCH: 27 pg (ref 26.0–34.0)
MCHC: 33.2 g/dL (ref 30.0–36.0)
MCV: 81.3 fL (ref 80.0–100.0)
Platelets: 626 10*3/uL — ABNORMAL HIGH (ref 150–400)
RBC: 3.85 MIL/uL — ABNORMAL LOW (ref 4.22–5.81)
RDW: 14.8 % (ref 11.5–15.5)
WBC: 10.4 10*3/uL (ref 4.0–10.5)
nRBC: 0 % (ref 0.0–0.2)

## 2018-12-09 NOTE — Progress Notes (Signed)
PROGRESS NOTE  Kevin [name] DPO:[id number] DOB: 09-24-1955 DOA: 12/02/2018 PCP: Berkley Harvey, NP  HPI/Recap of past 24 hours: Kevin Appl Lundeenis a 63 y.o.malewith medical history significant ofhead and neck cancer status post radiation, hypertension, hypothyroidism, pulmonary nodule presenting to the hospital for evaluation of cough, abnormal labs, and abnormal chest x-ray. Patient states he has been coughing for the past 2 days. He previously had slight discomfort in 1 of his ribs on the right side. Denies any left-sided or substernal chest pain/pressure. Denies any injuries to his ribs. States he went to his PCPs office yesterday and was told his white blood cell count was high on labs. He was also told that his chest x-ray showed an infection. He was advised to come into the ED for further evaluation. Denies any fevers or chills. Reports feeling slightly short of breath with exertion for the past few days. Denies any nausea, vomiting, abdominal pain, or diarrhea. No other complaints.  Chest CT showing a large loculated gas and fluid collection within the right lateral and posterior thorax abutting the pleural surface, consistent with a large empyema measuring approximately 10 x 5.3 x 14.9 cm and is contiguous with a smaller loculated fluid collection at the right lung base. Patient received vancomycin, cefepime, and 1 L IV fluid bolus in the ED.  Interventional radiology consulted for CT-guided chest tube placement done on 12/04/18.    Interventional radiology consulted for right perinephric abscess drainage. Completed on 12/06/18.  12/09/18: Patient seen and examined at bedside, resting comfortably, no complaints.  Continues with right-sided drain and chest tube in place.  Continues to be frustrated about length of hospitalization.  Denies headache, no fevers no chills/night sweats, no nauseous vomiting/diarrhea, no chest pain, no palpitations, no abdominal pain.  No acute events  overnight per nursing staff.  Assessment/Plan: Principal Problem:   Empyema (HCC) Active Problems:   Hypothyroid   Pulmonary nodule   Anemia   Hyponatremia  Large R empyema 2/2 MSSA Presenting from his PCP office for leukocytosis with complaints of right-sided rib pain and persistent cough.  Patient was found to have a large right-sided empyema.  Patient underwent chest tube insertion by IR on 12/04/2018.  No malignancy noted on cytology.  Etiology likely secondary to right perinephric abscess. --WBC 17.1-->13.8-->11.5-->10.4 --Continue chest tube to low wall suction, management per IR/CT surgery --Vancomycin/Zosyn de-escalated to cefazolin on 12/08/2018 --Continue to monitor oxygen status, maintain SPO2 greater than 92%; currently on room air --CT surgery plans for right VATS decort once right perinephric abscess has been drained; will need interval imaging for surveillance; will defer to IR/CTS for timing  Right Perinephric abscess  CT abdomen/pelvis on 12/03/2018 notable for right posterior perinephric space abscess measuring 7.7 x 5.8 x 8.3 cm which is also appreciated on MRI abdomen/pelvis.  Underwent CT-guided right drain placement on 12/06/2018 by IR. --Leukocytosis improving, WBC 17.1-->13.8-->11.5-->10.4 --Continues with purulent output, 60 mL's past 24 hours --Anaerobic culture with MSSA --Continue cefazolin as above --Continue monitor fever curve and daily CBC  Thrombocythemia Platelets on presentation greater than 600K, could be reactive --Continue Lovenox DVT prophylaxis  Hx of oropharyngeal cancer --Follow up outpatient with oncology  Severe protein calorie malnutrition Albumin 1.9, BMI 21 --Dietitian consult  Physical debility PT OT.  No PT follow-up recommended by PT   DVT prophylaxis:  Subcu Lovenox daily Code Status:  Full code Family Communication:  None Disposition Plan:  Discharge to home when cardiothoracic surgery signs off. Consults called: IR,  cardiothoracic  surgery   Objective: Vitals:   12/07/18 1320 12/07/18 2059 12/08/18 0514 12/09/18 1320  BP: 112/74 113/75 122/72 118/74  Pulse: 76 70 72 77  Resp: 19 16 18 18   Temp: 98.6 F (37 C) 98.3 F (36.8 C) 97.7 F (36.5 C) 98.2 F (36.8 C)  TempSrc: Oral Oral Oral Oral  SpO2: 98% 98% 100% 98%  Weight:      Height:        Intake/Output Summary (Last 24 hours) at 12/09/2018 1502 Last data filed at 12/09/2018 0641 Gross per 24 hour  Intake 350 ml  Output 60 ml  Net 290 ml   Filed Weights   12/03/18 0215 12/03/18 1659  Weight: 59 kg 60.2 kg    Exam:  . General: 63 y.o. year-old male thin built in no acute distress.  Alert and oriented x4.   . Cardiovascular: Regular rate and rhythm no rubs or gallops.  No JVD or thyromegaly noted.   Marland Kitchen Respiratory: Clear to auscultation bilaterally without wheezes, crackles or rales.  Normal respiratory effort.  Site of chest tube with no erythema or edema. . Abdomen: Soft nontender nondistended normal bowel sounds present.  Noted right-sided drain . Musculoskeletal: Trace lower extremity edema bilaterally right greater than left.  2 out of 4 pulses in all 4 extremities. Marland Kitchen Psychiatry: Mood is appropriate for condition and setting.   Data Reviewed: CBC: Recent Labs  Lab 12/02/18 1645  12/05/18 0812 12/06/18 0649 12/07/18 0337 12/08/18 0207 12/09/18 0223  WBC 18.9*   < > 17.1* 17.1* 13.8* 11.5* 10.4  NEUTROABS 16.3*  --  14.7*  --   --   --   --   HGB 9.4*   < > 10.3* 10.7* 10.0* 10.1* 10.4*  HCT 28.3*   < > 31.0* 32.3* 30.6* 31.2* 31.3*  MCV 81.6   < > 81.4 81.0 82.3 81.7 81.3  PLT 601*   < > 602* 666* 662* 628* 626*   < > = values in this interval not displayed.   Basic Metabolic Panel: Recent Labs  Lab 12/03/18 0336 12/05/18 0812 12/06/18 0649 12/07/18 0337 12/08/18 0207 12/09/18 0223  NA 134* 135 134* 135 135 133*  K 3.1* 4.0 3.5 4.5 4.6 4.0  CL 100 102 101 101 100 98  CO2 24 25 26 25 26 25   GLUCOSE 104* 105*  102* 90 93 97  BUN 6* 10 11 10 11 13   CREATININE 0.83 0.95 1.06 1.14 1.22 1.10  CALCIUM 8.3* 8.5* 8.4* 8.7* 8.7* 8.6*  MG 1.8 2.1  --   --   --   --   PHOS  --  3.5  --   --   --   --    GFR: Estimated Creatinine Clearance: 60 mL/min (by C-G formula based on SCr of 1.1 mg/dL). Liver Function Tests: Recent Labs  Lab 12/02/18 1800 12/05/18 0812  AST 21 16  ALT 17 18  ALKPHOS 136* 122  BILITOT 0.3 0.5  PROT 6.3* 6.3*  ALBUMIN 2.0* 1.9*   No results for input(s): LIPASE, AMYLASE in the last 168 hours. No results for input(s): AMMONIA in the last 168 hours. Coagulation Profile: Recent Labs  Lab 12/04/18 0316  INR 1.3*   Cardiac Enzymes: No results for input(s): CKTOTAL, CKMB, CKMBINDEX, TROPONINI in the last 168 hours. BNP (last 3 results) No results for input(s): PROBNP in the last 8760 hours. HbA1C: No results for input(s): HGBA1C in the last 72 hours. CBG: No results for input(s): GLUCAP  in the last 168 hours. Lipid Profile: No results for input(s): CHOL, HDL, LDLCALC, TRIG, CHOLHDL, LDLDIRECT in the last 72 hours. Thyroid Function Tests: No results for input(s): TSH, T4TOTAL, FREET4, T3FREE, THYROIDAB in the last 72 hours. Anemia Panel: No results for input(s): VITAMINB12, FOLATE, FERRITIN, TIBC, IRON, RETICCTPCT in the last 72 hours. Urine analysis:    Component Value Date/Time   COLORURINE STRAW (A) 12/03/2018 0343   APPEARANCEUR CLEAR 12/03/2018 0343   LABSPEC 1.011 12/03/2018 0343   PHURINE 7.0 12/03/2018 0343   GLUCOSEU NEGATIVE 12/03/2018 0343   HGBUR NEGATIVE 12/03/2018 0343   BILIRUBINUR NEGATIVE 12/03/2018 0343   KETONESUR NEGATIVE 12/03/2018 0343   PROTEINUR NEGATIVE 12/03/2018 0343   NITRITE NEGATIVE 12/03/2018 0343   LEUKOCYTESUR NEGATIVE 12/03/2018 0343   Sepsis Labs: @LABRCNTIP (procalcitonin:4,lacticidven:4)  ) Recent Results (from the past 240 hour(s))  Blood culture (routine x 2)     Status: None   Collection Time: 12/02/18 11:29 PM    Specimen: BLOOD  Result Value Ref Range Status   Specimen Description BLOOD RIGHT ANTECUBITAL  Final   Special Requests   Final    BOTTLES DRAWN AEROBIC AND ANAEROBIC Blood Culture results may not be optimal due to an excessive volume of blood received in culture bottles   Culture   Final    NO GROWTH 5 DAYS Performed at [facility], [place] [address], [place], [place] [address]    Report Status 12/08/2018 FINAL  Final  Blood culture (routine x 2)     Status: None   Collection Time: 12/02/18 11:38 PM   Specimen: BLOOD  Result Value Ref Range Status   Specimen Description BLOOD LEFT ANTECUBITAL  Final   Special Requests   Final    BOTTLES DRAWN AEROBIC AND ANAEROBIC Blood Culture results may not be optimal due to an excessive volume of blood received in culture bottles   Culture   Final    NO GROWTH 5 DAYS Performed at [facility], [place] [address], [place], [place] [address]    Report Status 12/08/2018 FINAL  Final  SARS Coronavirus 2 (CEPHEID- Performed in [place] hospital lab), Hosp Order     Status: None   Collection Time: 12/03/18  1:04 AM   Specimen: Nasopharyngeal Swab  Result Value Ref Range Status   SARS Coronavirus 2 NEGATIVE NEGATIVE Final    Comment: (NOTE) If result is NEGATIVE SARS-CoV-2 target nucleic acids are NOT DETECTED. The SARS-CoV-2 RNA is generally detectable in upper and lower  respiratory specimens during the acute phase of infection. The lowest  concentration of SARS-CoV-2 viral copies this assay can detect is 250  copies / mL. A negative result does not preclude SARS-CoV-2 infection  and should not be used as the sole basis for treatment or other  patient management decisions.  A negative result may occur with  improper specimen collection / handling, submission of specimen other  than nasopharyngeal swab, presence of viral mutation(s) within the  areas targeted by this assay, and inadequate number of viral copies  (<250 copies / mL). A  negative result must be combined with clinical  observations, patient history, and epidemiological information. If result is POSITIVE SARS-CoV-2 target nucleic acids are DETECTED. The SARS-CoV-2 RNA is generally detectable in upper and lower  respiratory specimens dur ing the acute phase of infection.  Positive  results are indicative of active infection with SARS-CoV-2.  Clinical  correlation with patient history and other diagnostic information is  necessary to determine  patient infection status.  Positive results do  not rule out bacterial infection or co-infection with other viruses. If result is PRESUMPTIVE POSTIVE SARS-CoV-2 nucleic acids MAY BE PRESENT.   A presumptive positive result was obtained on the submitted specimen  and confirmed on repeat testing.  While 2019 novel coronavirus  (SARS-CoV-2) nucleic acids may be present in the submitted sample  additional confirmatory testing may be necessary for epidemiological  and / or clinical management purposes  to differentiate between  SARS-CoV-2 and other Sarbecovirus currently known to infect humans.  If clinically indicated additional testing with an alternate test  methodology [phone]) is advised. The SARS-CoV-2 RNA is generally  detectable in upper and lower respiratory sp ecimens during the acute  phase of infection. The expected result is Negative. Fact Sheet for Patients:  StrictlyIdeas.no Fact Sheet for Healthcare Providers: BankingDealers.co.za This test is not yet approved or cleared by the [place] FDA and has been authorized for detection and/or diagnosis of SARS-CoV-2 by FDA under an Emergency Use Authorization (EUA).  This EUA will remain in effect (meaning this test can be used) for the duration of the COVID-19 declaration under Section 564(b)(1) of the Act, 21 U.S.C. section 360bbb-3(b)(1), unless the authorization is terminated or revoked sooner. Performed  at [facility], [place] [address] [place], [place] [address]   Aerobic/Anaerobic Culture (surgical/deep wound)     Status: None (Preliminary result)   Collection Time: 12/06/18 10:00 AM   Specimen: Abscess  Result Value Ref Range Status   Specimen Description ABSCESS  Final   Special Requests NONE  Final   Gram Stain   Final    ABUNDANT WBC PRESENT, PREDOMINANTLY PMN MODERATE GRAM POSITIVE COCCI IN PAIRS IN CLUSTERS Performed at [facility], [address], [place], [place] [address]    Culture   Final    MODERATE STAPHYLOCOCCUS AUREUS NO ANAEROBES ISOLATED; CULTURE IN PROGRESS FOR 5 DAYS    Report Status PENDING  Incomplete   Organism ID, Bacteria STAPHYLOCOCCUS AUREUS  Final      Susceptibility   Staphylococcus aureus - MIC*    CIPROFLOXACIN <=0.5 SENSITIVE Sensitive     ERYTHROMYCIN <=0.25 SENSITIVE Sensitive     GENTAMICIN <=0.5 SENSITIVE Sensitive     OXACILLIN 0.5 SENSITIVE Sensitive     TETRACYCLINE <=1 SENSITIVE Sensitive     VANCOMYCIN <=0.5 SENSITIVE Sensitive     TRIMETH/SULFA <=10 SENSITIVE Sensitive     CLINDAMYCIN <=0.25 SENSITIVE Sensitive     RIFAMPIN <=0.5 SENSITIVE Sensitive     Inducible Clindamycin NEGATIVE Sensitive     * MODERATE STAPHYLOCOCCUS AUREUS      Studies: Dg Chest Port 1 View  Result Date: 12/09/2018 CLINICAL DATA:  Empyema.  Chest tube EXAM: PORTABLE CHEST 1 VIEW COMPARISON:  12/02/2018 FINDINGS: Interval placement of right chest tube coiled within the empyema. Near complete drainage of empyema. Small amount of residual pleural thickening on the right. No pneumothorax. Mild airspace disease in the right base Left lung clear. IMPRESSION: Right chest tube in place with satisfactory drainage of empyema. No pneumothorax. Electronically Signed   By: Franchot Gallo M.D.   On: 12/09/2018 12:58    Scheduled Meds: . amLODipine  10 mg Oral Daily  . Chlorhexidine Gluconate Cloth  6 each Topical Daily  . enoxaparin (LOVENOX) injection  40  mg Subcutaneous Q24H  . levothyroxine  75 mcg Oral QAC breakfast  . multivitamin with minerals  1 tablet Oral Daily  . mupirocin ointment  1  application Nasal BID  . sodium chloride flush  3 mL Intravenous Once  . sodium chloride flush  5 mL Intracatheter Q8H    Continuous Infusions: .  ceFAZolin (ANCEF) IV 2 g (12/09/18 1430)     LOS: 6 days   Time spent: 36 minutes spent on chart review, discussion with nursing staff, consultants, updating family and interview/physical exam; more than 50% of that time was spent in counseling and/or coordination of care.  Levy Wellman J [place] DO [facility] Pager [phone]  If 7PM-7AM, please contact night-coverage [email or web address] Password [email or web address] 12/09/2018, 3:02 PM

## 2018-12-09 NOTE — Progress Notes (Addendum)
Referring Physician(s): Dr Johnette Abraham [place]  Supervising Physician: Sandi Mariscal  Patient Status:  [facility] - In-pt  Chief Complaint:  Right chest tube- Empyema Placed 7/29 in IR  Rt perinephric abscess drain placed 8/1 in IR  Subjective:  Pt is up in chair next to bed Getting around in room  Feeling better for sure OP from drain still 60 cc daily or more OP from CT minimal per pt  Allergies: Patient has no known allergies.  Medications: Prior to Admission medications   Medication Sig Start Date End Date Taking? Authorizing Provider  amLODipine (NORVASC) 10 MG tablet Take 10 mg by mouth daily.  05/22/16  Yes [provider]  levothyroxine (SYNTHROID) 75 MCG tablet Take 75 mcg by mouth daily before breakfast.   Yes [provider]     Vital Signs: BP 122/72 (BP Location: Left Arm)   Pulse 72   Temp 97.7 F (36.5 C) (Oral)   Resp 18   Ht 5\' 6"  (1.676 m)   Wt 132 lb 11.5 oz (60.2 kg)   SpO2 100%   BMI 21.42 kg/m   Physical Exam Vitals signs reviewed.  Pulmonary:     Effort: Pulmonary effort is normal.     Breath sounds: Normal breath sounds.  Musculoskeletal: Normal range of motion.  Skin:    General: Skin is warm and dry.     Comments: Sites of Rt CT and Rt perinephric abscess drain are clean and dry NT no bleeding  Chest tube is intact No air leak 150 cc total in chambers  OP of drain is purulent 60 cc yesterday  Culture: MODERATE STAPHYLOCOCCUS AUREUS  NO ANAEROBES ISOLATED; CULTURE IN PROGRESS FOR 5 DAYS  Report Status PENDING  Organism ID, Bacteria STAPHYLOCOCCUS AUREUS     Neurological:     Mental Status: He is alert and oriented to person, place, and time.  Psychiatric:        Behavior: Behavior normal.     Imaging: Ct Image Guided Drainage By Percutaneous Catheter  Result Date: 12/06/2018 CLINICAL DATA:  Empyema, post drainage. CT demonstrates complex right retroperitoneal/perinephric collection. MR suggests perinephric  abscess. Drainage requested. EXAM: CT GUIDED DRAINAGE OF RIGHT RETROPERITONEAL ABSCESS ANESTHESIA/SEDATION: Intravenous Fentanyl 163mcg and Versed 2mg  were administered as conscious sedation during continuous monitoring of the patient's level of consciousness and physiological / cardiorespiratory status by the radiology RN, with a total moderate sedation time of 24 minutes. PROCEDURE: The procedure, risks, benefits, and alternatives were explained to the patient. Questions regarding the procedure were encouraged and answered. The patient understands and consents to the procedure. Patient placed prone. Select axial scans through the upper abdomen obtained. The retroperitoneal collection was localized, and an appropriate skin entry site was determined and marked. The operative field was prepped with chlorhexidinein a sterile fashion, and a sterile drape was applied covering the operative field. A sterile gown and sterile gloves were used for the procedure. Local anesthesia was provided with 1% Lidocaine. Under CT fluoroscopic guidance, a 19 gauge percutaneous entry needle advanced into the collection. Purulent material could be aspirated. An Amplatz guidewire advanced easily within the collection, its position confirmed on CT. Tract was dilated to facilitate placement of a 12 French pigtail drain catheter, formed centrally within the collection. CT confirmed appropriate positioning. 20 mL of purulent aspirate sent for Gram stain and culture. The catheter was secured externally with 0 Prolene suture and StatLock and placed to gravity drain bag. The patient tolerated the procedure well. COMPLICATIONS: None  immediate FINDINGS: Complex right retroperitoneal collection posterior to the kidney was localized. Purulent material was aspirated. 12 French drain catheter placed as above. Sample of the aspirate sent for Gram stain and culture. IMPRESSION: 1. Technically successful CT-guided right retroperitoneal drain catheter  placement. Electronically Signed   By: Lucrezia Europe M.D.   On: 12/06/2018 10:38    Labs:  CBC: Recent Labs    12/06/18 0649 12/07/18 0337 12/08/18 0207 12/09/18 0223  WBC 17.1* 13.8* 11.5* 10.4  HGB 10.7* 10.0* 10.1* 10.4*  HCT 32.3* 30.6* 31.2* 31.3*  PLT 666* 662* 628* 626*    COAGS: Recent Labs    12/04/18 0316  INR 1.3*    BMP: Recent Labs    12/06/18 0649 12/07/18 0337 12/08/18 0207 12/09/18 0223  NA 134* 135 135 133*  K 3.5 4.5 4.6 4.0  CL 101 101 100 98  CO2 26 25 26 25   GLUCOSE 102* 90 93 97  BUN 11 10 11 13   CALCIUM 8.4* 8.7* 8.7* 8.6*  CREATININE 1.06 1.14 1.22 1.10  GFRNONAA >60 >60 >60 >60  GFRAA >60 >60 >60 >60    LIVER FUNCTION TESTS: Recent Labs    12/02/18 1800 12/05/18 0812  BILITOT 0.3 0.5  AST 21 16  ALT 17 18  ALKPHOS 136* 122  PROT 6.3* 6.3*  ALBUMIN 2.0* 1.9*    Assessment and Plan:  Rt chest tube intact Will order CXR today Rt perinephric abscess drain intact-- significant OP Will follow   Electronically Signed: Lavonia Drafts, PA-C 12/09/2018, 11:48 AM   I spent a total of 15 Minutes at the the patient's bedside AND on the patient's hospital floor or unit, greater than 50% of which was counseling/coordinating care for Rt CT and Rt perinephric abscess drain

## 2018-12-10 LAB — CBC
HCT: 32.2 % — ABNORMAL LOW (ref 39.0–52.0)
Hemoglobin: 10.4 g/dL — ABNORMAL LOW (ref 13.0–17.0)
MCH: 26.8 pg (ref 26.0–34.0)
MCHC: 32.3 g/dL (ref 30.0–36.0)
MCV: 83 fL (ref 80.0–100.0)
Platelets: 648 10*3/uL — ABNORMAL HIGH (ref 150–400)
RBC: 3.88 MIL/uL — ABNORMAL LOW (ref 4.22–5.81)
RDW: 15.2 % (ref 11.5–15.5)
WBC: 11.4 10*3/uL — ABNORMAL HIGH (ref 4.0–10.5)
nRBC: 0 % (ref 0.0–0.2)

## 2018-12-10 LAB — BASIC METABOLIC PANEL
Anion gap: 12 (ref 5–15)
BUN: 15 mg/dL (ref 8–23)
CO2: 25 mmol/L (ref 22–32)
Calcium: 9 mg/dL (ref 8.9–10.3)
Chloride: 98 mmol/L (ref 98–111)
Creatinine, Ser: 1.15 mg/dL (ref 0.61–1.24)
GFR calc Af Amer: >60 mL/min (ref >60)
GFR calc non Af Amer: >60 mL/min (ref >60)
Glucose, Bld: 91 mg/dL (ref 70–99)
Potassium: 4.2 mmol/L (ref 3.5–5.1)
Sodium: 135 mmol/L (ref 135–145)

## 2018-12-10 NOTE — Progress Notes (Signed)
Kevin [name]  QHU:[id number] DOB: 1955-10-16 DOA: 12/02/2018 PCP: Berkley Harvey, NP   Brief Narrative:  Kevin [name] a 63 y.o.malewith medical history significant ofhead and neck cancer status post radiation, hypertension, hypothyroidism, pulmonary nodule presenting to the hospital for evaluation of cough, abnormal labs, and abnormal chest x-ray. Patient states he has been coughing for the past 2 days. He previously had slight discomfort in 1 of his ribs on the right side. Denies any left-sided or substernal chest pain/pressure. Denies any injuries to his ribs. States he went to his PCPs office yesterday and was told his white blood cell count was high on labs. He was also told that his chest x-ray showed an infection. He was advised to come into the ED for further evaluation. Denies any fevers or chills. Reports feeling slightly short of breath with exertion for the past few days. Denies any nausea, vomiting, abdominal pain, or diarrhea. No other complaints.  Chest CT showing a large loculated gas and fluid collection within the right lateral and posterior thorax abutting the pleural surface, consistent with a large empyema measuring approximately 10 x 5.3 x 14.9 cm and is contiguous with a smaller loculated fluid collection at the right lung base. Patient received vancomycin, cefepime, and 1 L IV fluid bolus in the ED. Interventional radiology consulted for CT-guided chest tube placement done on 12/04/18.    Interventional radiology consulted for right perinephric abscess drainage. Completed on 12/06/18.  Consultants:   CT surgery  IR  Procedures:   CT-guided chest tube placement on 12/04/2018.  Right perinephric abscess drainage placed on 12/06/2018  Antimicrobials:   Vancomycin/Zosyn switched to cefazolin on 12/08/2018   Subjective: Patient seen and examined.  Eating breakfast.  Has no complaints.  Denies any shortness of breath or any pain.    LOS: 7 days   Assessment & Plan:   Principal Problem:   Empyema ([place]) Active Problems:   Hypothyroid   Pulmonary nodule   Anemia   Hyponatremia  Large R empyema 2/2 MSSA Presenting from his PCP office for leukocytosis with complaints of right-sided rib pain and persistent cough.  Patient was found to have a large right-sided empyema.  Patient underwent chest tube insertion by IR on 12/04/2018.  No malignancy noted on cytology.  Etiology likely secondary to right perinephric abscess.  Continues to have chest tube to low wall suction.  Managed by CT surgery/IR.  Continue cefazolin.  He is comfortable on room air.  Plan for right-sided VATS once perinephric abscess has drained completely.  Right Perinephric abscess  CT abdomen/pelvis on 12/03/2018 notable for right posterior perinephric space abscess measuring 7.7 x 5.8 x 8.3 cm which is also appreciated on MRI abdomen/pelvis.  Underwent CT-guided right drain placement on 12/06/2018 by IR.  Leukocytosis improving.  Minimal to no drainage from the drain since last 24 to 48 hours.  Will defer to IR for the timing of removal of the drain.  Continue cefazolin.  Thrombocythemia:  Platelets on presentation greater than 600K, could be reactive --Continue Lovenox DVT prophylaxis  Hx of oropharyngeal cancer --Follow up outpatient with oncology  Severe protein calorie malnutrition Albumin 1.9, BMI 21 --Dietitian consult  DVT prophylaxis: Subcu Lovenox daily Code Status: Full code Family Communication: None Disposition Plan: Discharge to home when cardiothoracic surgery signs off. Consults called:IR, cardiothoracic surgery   Objective: Vitals:   12/07/18 2059 12/08/18 0514 12/09/18 1320 12/09/18 2102  BP: 113/75 122/72 118/74 111/72  Pulse: 70 72  77 74  Resp: 16 18 18    Temp: 98.3 F (36.8 C) 97.7 F (36.5 C) 98.2 F (36.8 C) 98.2 F (36.8 C)  TempSrc: Oral Oral Oral   SpO2: 98% 100% 98% 99%  Weight:      Height:         Intake/Output Summary (Last 24 hours) at 12/10/2018 0855 Last data filed at 12/09/2018 1852 Gross per 24 hour  Intake 360 ml  Output 10 ml  Net 350 ml   Filed Weights   12/03/18 0215 12/03/18 1659  Weight: 59 kg 60.2 kg    Examination:  General exam: Appears calm and comfortable  Respiratory system: Clear to auscultation. Respiratory effort normal.  Has chest tube in place at the right posterior area. Cardiovascular system: S1 & S2 heard, RRR. No JVD, murmurs, rubs, gallops or clicks. No pedal edema. Gastrointestinal system: Abdomen is nondistended, soft and nontender. No organomegaly or masses felt. Normal bowel sounds heard.  Has perinephric drain on the right side. Central nervous system: Alert and oriented. No focal neurological deficits. Extremities: Symmetric 5 x 5 power. Skin: No rashes, lesions or ulcers Psychiatry: Judgement and insight appear normal. Mood & affect appropriate.    Data Reviewed: I have personally reviewed following labs and imaging studies  CBC: Recent Labs  Lab 12/05/18 0812 12/06/18 0649 12/07/18 0337 12/08/18 0207 12/09/18 0223 12/10/18 0229  WBC 17.1* 17.1* 13.8* 11.5* 10.4 11.4*  NEUTROABS 14.7*  --   --   --   --   --   HGB 10.3* 10.7* 10.0* 10.1* 10.4* 10.4*  HCT 31.0* 32.3* 30.6* 31.2* 31.3* 32.2*  MCV 81.4 81.0 82.3 81.7 81.3 83.0  PLT 602* 666* 662* 628* 626* 564*   Basic Metabolic Panel: Recent Labs  Lab 12/05/18 0812 12/06/18 0649 12/07/18 0337 12/08/18 0207 12/09/18 0223 12/10/18 0229  NA 135 134* 135 135 133* 135  K 4.0 3.5 4.5 4.6 4.0 4.2  CL 102 101 101 100 98 98  CO2 25 26 25 26 25 25   GLUCOSE 105* 102* 90 93 97 91  BUN 10 11 10 11 13 15   CREATININE 0.95 1.06 1.14 1.22 1.10 1.15  CALCIUM 8.5* 8.4* 8.7* 8.7* 8.6* 9.0  MG 2.1  --   --   --   --   --   PHOS 3.5  --   --   --   --   --    GFR: Estimated Creatinine Clearance: 57.4 mL/min (by C-G formula based on SCr of 1.15 mg/dL). Liver Function Tests: Recent Labs   Lab 12/05/18 0812  AST 16  ALT 18  ALKPHOS 122  BILITOT 0.5  PROT 6.3*  ALBUMIN 1.9*   No results for input(s): LIPASE, AMYLASE in the last 168 hours. No results for input(s): AMMONIA in the last 168 hours. Coagulation Profile: Recent Labs  Lab 12/04/18 0316  INR 1.3*   Cardiac Enzymes: No results for input(s): CKTOTAL, CKMB, CKMBINDEX, TROPONINI in the last 168 hours. BNP (last 3 results) No results for input(s): PROBNP in the last 8760 hours. HbA1C: No results for input(s): HGBA1C in the last 72 hours. CBG: No results for input(s): GLUCAP in the last 168 hours. Lipid Profile: No results for input(s): CHOL, HDL, LDLCALC, TRIG, CHOLHDL, LDLDIRECT in the last 72 hours. Thyroid Function Tests: No results for input(s): TSH, T4TOTAL, FREET4, T3FREE, THYROIDAB in the last 72 hours. Anemia Panel: No results for input(s): VITAMINB12, FOLATE, FERRITIN, TIBC, IRON, RETICCTPCT in the last 72 hours. Sepsis  Labs: No results for input(s): PROCALCITON, LATICACIDVEN in the last 168 hours.  Recent Results (from the past 240 hour(s))  Blood culture (routine x 2)     Status: None   Collection Time: 12/02/18 11:29 PM   Specimen: BLOOD  Result Value Ref Range Status   Specimen Description BLOOD RIGHT ANTECUBITAL  Final   Special Requests   Final    BOTTLES DRAWN AEROBIC AND ANAEROBIC Blood Culture results may not be optimal due to an excessive volume of blood received in culture bottles   Culture   Final    NO GROWTH 5 DAYS Performed at [facility], [place] [address], [place], [place] [address]    Report Status 12/08/2018 FINAL  Final  Blood culture (routine x 2)     Status: None   Collection Time: 12/02/18 11:38 PM   Specimen: BLOOD  Result Value Ref Range Status   Specimen Description BLOOD LEFT ANTECUBITAL  Final   Special Requests   Final    BOTTLES DRAWN AEROBIC AND ANAEROBIC Blood Culture results may not be optimal due to an excessive volume of blood received in culture  bottles   Culture   Final    NO GROWTH 5 DAYS Performed at [facility], [place] [address] [place], [place] [address]    Report Status 12/08/2018 FINAL  Final  SARS Coronavirus 2 (CEPHEID- Performed in [place] hospital lab), Hosp Order     Status: None   Collection Time: 12/03/18  1:04 AM   Specimen: Nasopharyngeal Swab  Result Value Ref Range Status   SARS Coronavirus 2 NEGATIVE NEGATIVE Final    Comment: (NOTE) If result is NEGATIVE SARS-CoV-2 target nucleic acids are NOT DETECTED. The SARS-CoV-2 RNA is generally detectable in upper and lower  respiratory specimens during the acute phase of infection. The lowest  concentration of SARS-CoV-2 viral copies this assay can detect is 250  copies / mL. A negative result does not preclude SARS-CoV-2 infection  and should not be used as the sole basis for treatment or other  patient management decisions.  A negative result may occur with  improper specimen collection / handling, submission of specimen other  than nasopharyngeal swab, presence of viral mutation(s) within the  areas targeted by this assay, and inadequate number of viral copies  (<250 copies / mL). A negative result must be combined with clinical  observations, patient history, and epidemiological information. If result is POSITIVE SARS-CoV-2 target nucleic acids are DETECTED. The SARS-CoV-2 RNA is generally detectable in upper and lower  respiratory specimens dur ing the acute phase of infection.  Positive  results are indicative of active infection with SARS-CoV-2.  Clinical  correlation with patient history and other diagnostic information is  necessary to determine patient infection status.  Positive results do  not rule out bacterial infection or co-infection with other viruses. If result is PRESUMPTIVE POSTIVE SARS-CoV-2 nucleic acids MAY BE PRESENT.   A presumptive positive result was obtained on the submitted specimen  and confirmed on repeat testing.   While 2019 novel coronavirus  (SARS-CoV-2) nucleic acids may be present in the submitted sample  additional confirmatory testing may be necessary for epidemiological  and / or clinical management purposes  to differentiate between  SARS-CoV-2 and other Sarbecovirus currently known to infect humans.  If clinically indicated additional testing with an alternate test  methodology [phone]) is advised. The SARS-CoV-2 RNA is generally  detectable in upper and lower respiratory sp ecimens during the acute  phase of infection. The expected result is Negative. Fact Sheet for Patients:  StrictlyIdeas.no Fact Sheet for Healthcare Providers: BankingDealers.co.za This test is not yet approved or cleared by the [place] FDA and has been authorized for detection and/or diagnosis of SARS-CoV-2 by FDA under an Emergency Use Authorization (EUA).  This EUA will remain in effect (meaning this test can be used) for the duration of the COVID-19 declaration under Section 564(b)(1) of the Act, 21 U.S.C. section 360bbb-3(b)(1), unless the authorization is terminated or revoked sooner. Performed at [facility], [place] [address] [name], [place] [address]   Aerobic/Anaerobic Culture (surgical/deep wound)     Status: None (Preliminary result)   Collection Time: 12/06/18 10:00 AM   Specimen: Abscess  Result Value Ref Range Status   Specimen Description ABSCESS  Final   Special Requests NONE  Final   Gram Stain   Final    ABUNDANT WBC PRESENT, PREDOMINANTLY PMN MODERATE GRAM POSITIVE COCCI IN PAIRS IN CLUSTERS Performed at [facility], [address] [place], [place] [address]    Culture   Final    MODERATE STAPHYLOCOCCUS AUREUS NO ANAEROBES ISOLATED; CULTURE IN Kevin FOR 5 DAYS    Report Status PENDING  Incomplete   Organism ID, Bacteria STAPHYLOCOCCUS AUREUS  Final      Susceptibility   Staphylococcus aureus - MIC*    CIPROFLOXACIN  <=0.5 SENSITIVE Sensitive     ERYTHROMYCIN <=0.25 SENSITIVE Sensitive     GENTAMICIN <=0.5 SENSITIVE Sensitive     OXACILLIN 0.5 SENSITIVE Sensitive     TETRACYCLINE <=1 SENSITIVE Sensitive     VANCOMYCIN <=0.5 SENSITIVE Sensitive     TRIMETH/SULFA <=10 SENSITIVE Sensitive     CLINDAMYCIN <=0.25 SENSITIVE Sensitive     RIFAMPIN <=0.5 SENSITIVE Sensitive     Inducible Clindamycin NEGATIVE Sensitive     * MODERATE STAPHYLOCOCCUS AUREUS      Radiology Studies: Dg Chest Port 1 View  Result Date: 12/09/2018 CLINICAL DATA:  Empyema.  Chest tube EXAM: PORTABLE CHEST 1 VIEW COMPARISON:  12/02/2018 FINDINGS: Interval placement of right chest tube coiled within the empyema. Near complete drainage of empyema. Small amount of residual pleural thickening on the right. No pneumothorax. Mild airspace disease in the right base Left lung clear. IMPRESSION: Right chest tube in place with satisfactory drainage of empyema. No pneumothorax. Electronically Signed   By: Franchot Gallo M.D.   On: 12/09/2018 12:58    Scheduled Meds: . amLODipine  10 mg Oral Daily  . Chlorhexidine Gluconate Cloth  6 each Topical Daily  . enoxaparin (LOVENOX) injection  40 mg Subcutaneous Q24H  . levothyroxine  75 mcg Oral QAC breakfast  . multivitamin with minerals  1 tablet Oral Daily  . mupirocin ointment  1 application Nasal BID  . sodium chloride flush  3 mL Intravenous Once  . sodium chloride flush  5 mL Intracatheter Q8H   Continuous Infusions: .  ceFAZolin (ANCEF) IV 2 g (12/10/18 0519)     Time spent: 30 minutes   Darliss Cheney, MD [facility] Pager [phone]  If 7PM-7AM, please contact night-coverage [email or web address] Password [email or web address] 12/10/2018, 8:55 AM

## 2018-12-10 NOTE — Progress Notes (Signed)
     [address]       [place] [address]             [phone]       I/O last 3 completed shifts: In: 710 [P.O.:600; Other:10; IV Piggyback:100] Out: 70 [Drains:70]  Will need repeat CT abd prior to surgery.  If perinephric abscess improved, will plan for surgery hopefully this week.  Kevin [name]

## 2018-12-10 NOTE — Progress Notes (Signed)
Nutrition Follow-up  RD working remotely.  DOCUMENTATION CODES:   Not applicable  INTERVENTION:   -Continue MVI with minerals daily -Continue snacks TID between meals  NUTRITION DIAGNOSIS:   Increased nutrient needs related to acute illness as evidenced by estimated needs.  Ongoing  GOAL:   Patient will meet greater than or equal to 90% of their needs  Progressing   MONITOR:   PO intake, Supplement acceptance, Labs, Weight trends, Skin, I & O's  REASON FOR ASSESSMENT:   Consult Assessment of nutrition requirement/status  ASSESSMENT:   Kevin [name] is a 63 y.o. 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  Patient states he has been coughing for the past 2 days.  He previously had slight discomfort in 1 of his ribs on the right side.  Denies any left-sided or substernal chest pain/pressure.  Denies any injuries to his ribs.  States he went to his PCPs office yesterday and was told his white blood cell count was high on labs.  He was also told that his chest x-ray showed an infection.  He was advised to come into the ED for further evaluation.  Denies any fevers or chills.  Reports feeling slightly short of breath with exertion for the past few days.  Denies any nausea, vomiting, abdominal pain, or diarrhea.  No other complaints.  7/31- s/p Placement of a 60F [place] drain into the loculated right hydropneumothorax. Small volume aspirated fluid sent for cytology as requested 8/1- s/p CT guided drainage of rt retroperitoneal abscess (20 ml aspirated)  Reviewed I/O's: +350 ml x 24 hours and +3.4 L since admission  UOP: 1.8 L x 24 hours  Chest tube output: 40 ml x 24 hours  Drain output: 5 ml x 24 hours  Per CVTS notes, plan for repeat CT of abdomen prior to surgery. VATS will hopefully occur  this week is perinephritic abscess is improved  Pt remains with good appetite; noted meal completion 50-100%. He is taking MVI.   Labs reviewed.   Diet Order:   Diet Order            Diet regular Room service appropriate? Yes; Fluid consistency: Thin  Diet effective now              EDUCATION NEEDS:   Education needs have been addressed  Skin:  Skin Assessment: Reviewed RN Assessment  Last BM:  12/09/18  Height:   Ht Readings from Last 1 Encounters:  12/03/18 5\' 6"  (1.676 m)    Weight:   Wt Readings from Last 1 Encounters:  12/03/18 60.2 kg    Ideal Body Weight:  64.5 kg  BMI:  Body mass index is 21.42 kg/m.  Estimated Nutritional Needs:   Kcal:  1800-2000  Protein:  90-105 grams  Fluid:  > 1.8 L    Mylissa Lambe A. Jimmye Norman, RD, LDN, [place] Registered Dietitian II Certified Diabetes Care and Education Specialist Pager: [phone] After hours Pager: [phone]

## 2018-12-11 ENCOUNTER — Inpatient Hospital Stay (HOSPITAL_COMMUNITY): Payer: BC Managed Care – PPO

## 2018-12-11 LAB — CBC
HCT: 33.1 % — ABNORMAL LOW (ref 39.0–52.0)
Hemoglobin: 10.8 g/dL — ABNORMAL LOW (ref 13.0–17.0)
MCH: 26.9 pg (ref 26.0–34.0)
MCHC: 32.6 g/dL (ref 30.0–36.0)
MCV: 82.5 fL (ref 80.0–100.0)
Platelets: 620 10*3/uL — ABNORMAL HIGH (ref 150–400)
RBC: 4.01 MIL/uL — ABNORMAL LOW (ref 4.22–5.81)
RDW: 15.3 % (ref 11.5–15.5)
WBC: 10.5 10*3/uL (ref 4.0–10.5)
nRBC: 0 % (ref 0.0–0.2)

## 2018-12-11 LAB — BASIC METABOLIC PANEL
Anion gap: 11 (ref 5–15)
BUN: 13 mg/dL (ref 8–23)
CO2: 26 mmol/L (ref 22–32)
Calcium: 9.1 mg/dL (ref 8.9–10.3)
Chloride: 98 mmol/L (ref 98–111)
Creatinine, Ser: 1 mg/dL (ref 0.61–1.24)
GFR calc Af Amer: >60 mL/min (ref >60)
GFR calc non Af Amer: >60 mL/min (ref >60)
Glucose, Bld: 96 mg/dL (ref 70–99)
Potassium: 4.3 mmol/L (ref 3.5–5.1)
Sodium: 135 mmol/L (ref 135–145)

## 2018-12-11 LAB — AEROBIC/ANAEROBIC CULTURE W GRAM STAIN (SURGICAL/DEEP WOUND)

## 2018-12-11 MED ORDER — IOPAMIDOL (ISOVUE-300) INJECTION 61%
100.0000 mL | Freq: Once | INTRAVENOUS | Status: AC | PRN
  Administered 2018-12-11: 100 mL via INTRAVENOUS

## 2018-12-11 NOTE — Progress Notes (Signed)
Patient ID: Kevin [name], male   DOB: 03/16/1956, 63 y.o.   MRN: [id number] Pt's latest CT reviewed by Dr. Earleen Newport; he recommends keeping existing rt perinephric drain in place as residual collection remains; cont drain irrigation/close output monitoring.

## 2018-12-11 NOTE — Progress Notes (Signed)
PROGRESS NOTE    MACAULEY [name]  PPI:[id number] DOB: 1955-06-04 DOA: 12/02/2018 PCP: Berkley Harvey, NP   Brief Narrative:  Mora Appl Kevin [name] a 63 y.o.malewith medical history significant ofhead and neck cancer status post radiation, hypertension, hypothyroidism, pulmonary nodule presenting to the hospital for evaluation of cough, abnormal labs, and abnormal chest x-ray. Patient states he has been coughing for the past 2 days. He previously had slight discomfort in 1 of his ribs on the right side. Denies any left-sided or substernal chest pain/pressure. Denies any injuries to his ribs. States he went to his PCPs office yesterday and was told his white blood cell count was high on labs. He was also told that his chest x-ray showed an infection. He was advised to come into the ED for further evaluation. Denies any fevers or chills. Reports feeling slightly short of breath with exertion for the past few days. Denies any nausea, vomiting, abdominal pain, or diarrhea. No other complaints.  Chest CT showing a large loculated gas and fluid collection within the right lateral and posterior thorax abutting the pleural surface, consistent with a large empyema measuring approximately 10 x 5.3 x 14.9 cm and is contiguous with a smaller loculated fluid collection at the right lung base. Patient received vancomycin, cefepime, and 1 L IV fluid bolus in the ED. Interventional radiology consulted for CT-guided chest tube placement done on 12/04/18.    Interventional radiology consulted for right perinephric abscess drainage. Completed on 12/06/18.  Consultants:   CT surgery  IR  Procedures:   CT-guided chest tube placement on 12/04/2018.  Right perinephric abscess drainage placed on 12/06/2018  Antimicrobials:   Vancomycin/Zosyn switched to cefazolin on 12/08/2018   Subjective: Patient seen and examined.  Nurse practitioner from IR was in the room.  Patient was very upset stating that he  is not happy with poor communication.  He goes for the long and tells me that the poor communication started at the time of admission.  He tells me that he only saw me yesterday and then no other physician had seen him.  I did explain to him that there is a note from Dr. Kipp [name] from cardiothoracic surgery.  Per NP from IR, she is going to discuss with her attending about possibly repeating CT abdomen before removing the drain today.  He understands that his major issues are being managed by IR and CTS.    LOS: 8 days   Assessment & Plan:   Principal Problem:   Empyema ([place]) Active Problems:   Hypothyroid   Pulmonary nodule   Anemia   Hyponatremia  Large R empyema 2/2 MSSA Presenting from his PCP office for leukocytosis with complaints of right-sided rib pain and persistent cough.  Patient was found to have a large right-sided empyema.  Patient underwent chest tube insertion by IR on 12/04/2018.  No malignancy noted on cytology.  Etiology likely secondary to right perinephric abscess.  Continues to have chest tube to low wall suction.  Managed by CT surgery/IR.  Continue cefazolin.  He is comfortable on room air.  Plan for right-sided VATS once perinephric abscess has drained completely.  Right Perinephric abscess  CT abdomen/pelvis on 12/03/2018 notable for right posterior perinephric space abscess measuring 7.7 x 5.8 x 8.3 cm which is also appreciated on MRI abdomen/pelvis.  Underwent CT-guided right drain placement on 12/06/2018 by IR.  Leukocytosis resolved.  He is afebrile..  Minimal to no drainage from the drain since last 24 hours.  Will defer  to IR for the timing of removal of the drain, according to NP, the plan is to do CT abdomen today before removing the drain.  Continue cefazolin.  Thrombocythemia:  Platelets on presentation greater than 600K, could be reactive --Continue Lovenox DVT prophylaxis  Hx of oropharyngeal cancer --Follow up outpatient with oncology  Severe  protein calorie malnutrition Albumin 1.9, BMI 21 --Dietitian consult  DVT prophylaxis: Subcu Lovenox daily Code Status: Full code Family Communication: None Disposition Plan: Discharge to home when cardiothoracic surgery signs off. Consults called:IR, cardiothoracic surgery   Objective: Vitals:   12/09/18 2102 12/10/18 1312 12/10/18 2115 12/11/18 0558  BP: 111/72 123/69 125/78 113/70  Pulse: 74 78 73 66  Resp:  18    Temp: 98.2 F (36.8 C) 98 F (36.7 C) 97.7 F (36.5 C) (!) 97.5 F (36.4 C)  TempSrc:      SpO2: 99% 98% 99% 98%  Weight:      Height:        Intake/Output Summary (Last 24 hours) at 12/11/2018 1010 Last data filed at 12/11/2018 0900 Gross per 24 hour  Intake 225 ml  Output 17 ml  Net 208 ml   Filed Weights   12/03/18 0215 12/03/18 1659  Weight: 59 kg 60.2 kg    Examination:  General exam: Appears calm and comfortable  Respiratory system: Clear to auscultation. Respiratory effort normal.  Chest tube in right posterior thoracic area Cardiovascular system: S1 & S2 heard, RRR. No JVD, murmurs, rubs, gallops or clicks. No pedal edema. Gastrointestinal system: Abdomen is nondistended, soft and nontender. No organomegaly or masses felt. Normal bowel sounds heard.  Perinephric drain in the right flank area Central nervous system: Alert and oriented. No focal neurological deficits. Extremities: Symmetric 5 x 5 power. Skin: No rashes, lesions or ulcers Psychiatry: Judgement and insight appear normal. Mood & affect appropriate.    Data Reviewed: I have personally reviewed following labs and imaging studies  CBC: Recent Labs  Lab 12/05/18 0812  12/07/18 0337 12/08/18 0207 12/09/18 0223 12/10/18 0229 12/11/18 0329  WBC 17.1*   < > 13.8* 11.5* 10.4 11.4* 10.5  NEUTROABS 14.7*  --   --   --   --   --   --   HGB 10.3*   < > 10.0* 10.1* 10.4* 10.4* 10.8*  HCT 31.0*   < > 30.6* 31.2* 31.3* 32.2* 33.1*  MCV 81.4   < > 82.3 81.7 81.3 83.0 82.5  PLT 602*    < > 662* 628* 626* 648* 620*   < > = values in this interval not displayed.   Basic Metabolic Panel: Recent Labs  Lab 12/05/18 0812  12/07/18 0337 12/08/18 0207 12/09/18 0223 12/10/18 0229 12/11/18 0329  NA 135   < > 135 135 133* 135 135  K 4.0   < > 4.5 4.6 4.0 4.2 4.3  CL 102   < > 101 100 98 98 98  CO2 25   < > 25 26 25 25 26   GLUCOSE 105*   < > 90 93 97 91 96  BUN 10   < > 10 11 13 15 13   CREATININE 0.95   < > 1.14 1.22 1.10 1.15 1.00  CALCIUM 8.5*   < > 8.7* 8.7* 8.6* 9.0 9.1  MG 2.1  --   --   --   --   --   --   PHOS 3.5  --   --   --   --   --   --    < > =  values in this interval not displayed.   GFR: Estimated Creatinine Clearance: 66.1 mL/min (by C-G formula based on SCr of 1 mg/dL). Liver Function Tests: Recent Labs  Lab 12/05/18 0812  AST 16  ALT 18  ALKPHOS 122  BILITOT 0.5  PROT 6.3*  ALBUMIN 1.9*   No results for input(s): LIPASE, AMYLASE in the last 168 hours. No results for input(s): AMMONIA in the last 168 hours. Coagulation Profile: No results for input(s): INR, PROTIME in the last 168 hours. Cardiac Enzymes: No results for input(s): CKTOTAL, CKMB, CKMBINDEX, TROPONINI in the last 168 hours. BNP (last 3 results) No results for input(s): PROBNP in the last 8760 hours. HbA1C: No results for input(s): HGBA1C in the last 72 hours. CBG: No results for input(s): GLUCAP in the last 168 hours. Lipid Profile: No results for input(s): CHOL, HDL, LDLCALC, TRIG, CHOLHDL, LDLDIRECT in the last 72 hours. Thyroid Function Tests: No results for input(s): TSH, T4TOTAL, FREET4, T3FREE, THYROIDAB in the last 72 hours. Anemia Panel: No results for input(s): VITAMINB12, FOLATE, FERRITIN, TIBC, IRON, RETICCTPCT in the last 72 hours. Sepsis Labs: No results for input(s): PROCALCITON, LATICACIDVEN in the last 168 hours.  Recent Results (from the past 240 hour(s))  Blood culture (routine x 2)     Status: None   Collection Time: 12/02/18 11:29 PM   Specimen:  BLOOD  Result Value Ref Range Status   Specimen Description BLOOD RIGHT ANTECUBITAL  Final   Special Requests   Final    BOTTLES DRAWN AEROBIC AND ANAEROBIC Blood Culture results may not be optimal due to an excessive volume of blood received in culture bottles   Culture   Final    NO GROWTH 5 DAYS Performed at [facility], [place] [address], [place], [place] [address]    Report Status 12/08/2018 FINAL  Final  Blood culture (routine x 2)     Status: None   Collection Time: 12/02/18 11:38 PM   Specimen: BLOOD  Result Value Ref Range Status   Specimen Description BLOOD LEFT ANTECUBITAL  Final   Special Requests   Final    BOTTLES DRAWN AEROBIC AND ANAEROBIC Blood Culture results may not be optimal due to an excessive volume of blood received in culture bottles   Culture   Final    NO GROWTH 5 DAYS Performed at [facility], [place] [address], [place], [place] [address]    Report Status 12/08/2018 FINAL  Final  SARS Coronavirus 2 (CEPHEID- Performed in [place] hospital lab), Hosp Order     Status: None   Collection Time: 12/03/18  1:04 AM   Specimen: Nasopharyngeal Swab  Result Value Ref Range Status   SARS Coronavirus 2 NEGATIVE NEGATIVE Final    Comment: (NOTE) If result is NEGATIVE SARS-CoV-2 target nucleic acids are NOT DETECTED. The SARS-CoV-2 RNA is generally detectable in upper and lower  respiratory specimens during the acute phase of infection. The lowest  concentration of SARS-CoV-2 viral copies this assay can detect is 250  copies / mL. A negative result does not preclude SARS-CoV-2 infection  and should not be used as the sole basis for treatment or other  patient management decisions.  A negative result may occur with  improper specimen collection / handling, submission of specimen other  than nasopharyngeal swab, presence of viral mutation(s) within the  areas targeted by this assay, and inadequate number of viral copies  (<250 copies / mL). A negative  result must be combined with clinical  observations, patient history, and epidemiological information. If result is POSITIVE SARS-CoV-2 target nucleic acids are DETECTED. The SARS-CoV-2 RNA is generally detectable in upper and lower  respiratory specimens dur ing the acute phase of infection.  Positive  results are indicative of active infection with SARS-CoV-2.  Clinical  correlation with patient history and other diagnostic information is  necessary to determine patient infection status.  Positive results do  not rule out bacterial infection or co-infection with other viruses. If result is PRESUMPTIVE POSTIVE SARS-CoV-2 nucleic acids MAY BE PRESENT.   A presumptive positive result was obtained on the submitted specimen  and confirmed on repeat testing.  While 2019 novel coronavirus  (SARS-CoV-2) nucleic acids may be present in the submitted sample  additional confirmatory testing may be necessary for epidemiological  and / or clinical management purposes  to differentiate between  SARS-CoV-2 and other Sarbecovirus currently known to infect humans.  If clinically indicated additional testing with an alternate test  methodology [phone]) is advised. The SARS-CoV-2 RNA is generally  detectable in upper and lower respiratory sp ecimens during the acute  phase of infection. The expected result is Negative. Fact Sheet for Patients:  StrictlyIdeas.no Fact Sheet for Healthcare Providers: BankingDealers.co.za This test is not yet approved or cleared by the [place] FDA and has been authorized for detection and/or diagnosis of SARS-CoV-2 by FDA under an Emergency Use Authorization (EUA).  This EUA will remain in effect (meaning this test can be used) for the duration of the COVID-19 declaration under Section 564(b)(1) of the Act, 21 U.S.C. section 360bbb-3(b)(1), unless the authorization is terminated or revoked sooner. Performed at [facility], [place] [address], [place], [place] [address]   Aerobic/Anaerobic Culture (surgical/deep wound)     Status: None (Preliminary result)   Collection Time: 12/06/18 10:00 AM   Specimen: Abscess  Result Value Ref Range Status   Specimen Description ABSCESS  Final   Special Requests NONE  Final   Gram Stain   Final    ABUNDANT WBC PRESENT, PREDOMINANTLY PMN MODERATE GRAM POSITIVE COCCI IN PAIRS IN CLUSTERS Performed at [facility], [address], [place], [place] [address]    Culture   Final    MODERATE STAPHYLOCOCCUS AUREUS NO ANAEROBES ISOLATED; CULTURE IN PROGRESS FOR 5 DAYS    Report Status PENDING  Incomplete   Organism ID, Bacteria STAPHYLOCOCCUS AUREUS  Final      Susceptibility   Staphylococcus aureus - MIC*    CIPROFLOXACIN <=0.5 SENSITIVE Sensitive     ERYTHROMYCIN <=0.25 SENSITIVE Sensitive     GENTAMICIN <=0.5 SENSITIVE Sensitive     OXACILLIN 0.5 SENSITIVE Sensitive     TETRACYCLINE <=1 SENSITIVE Sensitive     VANCOMYCIN <=0.5 SENSITIVE Sensitive     TRIMETH/SULFA <=10 SENSITIVE Sensitive     CLINDAMYCIN <=0.25 SENSITIVE Sensitive     RIFAMPIN <=0.5 SENSITIVE Sensitive     Inducible Clindamycin NEGATIVE Sensitive     * MODERATE STAPHYLOCOCCUS AUREUS      Radiology Studies: Dg Chest Port 1 View  Result Date: 12/09/2018 CLINICAL DATA:  Empyema.  Chest tube EXAM: PORTABLE CHEST 1 VIEW COMPARISON:  12/02/2018 FINDINGS: Interval placement of right chest tube coiled within the empyema. Near complete drainage of empyema. Small amount of residual pleural thickening on the right. No pneumothorax. Mild airspace disease in the right base Left lung clear. IMPRESSION: Right chest tube in place with satisfactory drainage of empyema. No pneumothorax. Electronically Signed   By: Franchot Gallo  M.D.   On: 12/09/2018 12:58    Scheduled Meds: . amLODipine  10 mg Oral Daily  . Chlorhexidine Gluconate Cloth  6 each Topical Daily  . enoxaparin (LOVENOX) injection  40  mg Subcutaneous Q24H  . levothyroxine  75 mcg Oral QAC breakfast  . multivitamin with minerals  1 tablet Oral Daily  . mupirocin ointment  1 application Nasal BID  . sodium chloride flush  3 mL Intravenous Once  . sodium chloride flush  5 mL Intracatheter Q8H   Continuous Infusions: .  ceFAZolin (ANCEF) IV Stopped (12/11/18 2536)    Time spent: 26 minutes  Darliss Cheney, MD [facility] Pager [phone]  If 7PM-7AM, please contact night-coverage [email or web address] Password [email or web address] 12/11/2018, 10:10 AM

## 2018-12-11 NOTE — Progress Notes (Signed)
Pt back from CT scan, asking about results, paged IR as pt would like to speak to MDs.  Flushed catheter per orders with 5cc NS, easy flushing, immediate return of serous fluid with red flecks.

## 2018-12-11 NOTE — Progress Notes (Signed)
Referring Physician(s): Dr. Felecia [name]. Kevin [name]  Supervising Physician: Kevin [name]  Patient Status:  [name] - In-pt  Chief Complaint: Follow up right perinephritic abscess drain placed 8/1 by Dr. Vernard [name] and rigth chest tube placed 7/29 in IR  Subjective:  Patient sitting up in bed eating breakfast, reports great frustration with length of stay and lack of communication. He states that no one from CT surgery has seen him in days and this is very frustrating because he feels that the chest tube should be removed as it has not had any output. He also states there has been no output from the right perinephritic drain and he believes this needs to be removed as well so that he can proceed with VATS. He expresses frustration that he has been in the [name] for this long and believes that communication with CT surgery and IR has been poor. He states that he has not asked to be seen by either service. He reports being pleased with his care by the floor staff and hospitalists. He denies any pain, dyspnea, nausea, vomiting, diarrhea, fever or chills.  Allergies: Patient has no known allergies.  Medications: Prior to Admission medications   Medication Sig Start Date End Date Taking? Authorizing Provider  amLODipine (NORVASC) 10 MG tablet Take 10 mg by mouth daily.  05/22/16  Yes [provider]  levothyroxine (SYNTHROID) 75 MCG tablet Take 75 mcg by mouth daily before breakfast.   Yes [provider]     Vital Signs: BP 113/70 (BP Location: Left Arm)   Pulse 66   Temp (!) 97.5 F (36.4 C)   Resp 18   Ht 5\' 6"  (1.478 m)   Wt 132 lb 11.5 oz (60.2 kg)   SpO2 98%   BMI 21.42 kg/m   Physical Exam Vitals signs and nursing note reviewed.  Constitutional:      General: He is not in acute distress. HENT:     Head: Normocephalic.  Cardiovascular:     Rate and Rhythm: Normal rate.  Pulmonary:     Effort: Pulmonary effort is normal.     Comments: (+) right chest tube to  suction, no obvious air leak. 120 cc serosanguineous output in Pleurvac. Insertion site unremarkable. Abdominal:     Comments: (+) right perinephritic drain to gravity with scant purulent output, patient states bag has not been emptied in >24H. Insertion site clean, dry, dressed appropriately without erythema, bleeding or discharge. Suture and stat lock in tact.   Skin:    General: Skin is warm and dry.  Neurological:     Mental Status: He is alert. Mental status is at baseline.     Imaging: Dg Chest Port 1 View  Result Date: 12/09/2018 CLINICAL DATA:  Empyema.  Chest tube EXAM: PORTABLE CHEST 1 VIEW COMPARISON:  12/02/2018 FINDINGS: Interval placement of right chest tube coiled within the empyema. Near complete drainage of empyema. Small amount of residual pleural thickening on the right. No pneumothorax. Mild airspace disease in the right base Left lung clear. IMPRESSION: Right chest tube in place with satisfactory drainage of empyema. No pneumothorax. Electronically Signed   By: Kevin [name] M.D.   On: 12/09/2018 12:58    Labs:  CBC: Recent Labs    12/08/18 0207 12/09/18 0223 12/10/18 0229 12/11/18 0329  WBC 11.5* 10.4 11.4* 10.5  HGB 10.1* 10.4* 10.4* 10.8*  HCT 31.2* 31.3* 32.2* 33.1*  PLT 628* 626* 648* 620*    COAGS: Recent Labs    12/04/18 0316  INR 1.3*    BMP: Recent Labs    12/08/18 0207 12/09/18 0223 12/10/18 0229 12/11/18 0329  NA 135 133* 135 135  K 4.6 4.0 4.2 4.3  CL 100 98 98 98  CO2 26 25 25 26   GLUCOSE 93 97 91 96  BUN 11 13 15 13   CALCIUM 8.7* 8.6* 9.0 9.1  CREATININE 1.22 1.10 1.15 1.00  GFRNONAA >60 >60 >60 >60  GFRAA >60 >60 >60 >60    LIVER FUNCTION TESTS: Recent Labs    12/02/18 1800 12/05/18 0812  BILITOT 0.3 0.5  AST 21 16  ALT 17 18  ALKPHOS 136* 122  PROT 6.3* 6.3*  ALBUMIN 2.0* 1.9*    Assessment and Plan:  63 y/o M s/p right chest tube placement 7/29 in IR for empyema and right perinephritic abscess drain placed  8/1 in IR who is seen today for follow up. Patient reports frustrations with communication - Dr. Doristine [name] present in room discussing this with patient during my exam today.   Right chest tube insertion site unremarkable on exam today, no obvious air leak noted. Pleurvac with 120 cc serosanguineous output - per I/O no output in last 24H. Chest tube followed by CT surgery with plans for VATS during this admission. Further plans for chest tube per their service.  Right perinephritic drain to gravity with scant purulent drainage in bag today on my exam. Per RN they have been flushing drain without issue. Per I/O 17 cc output in last 24H, previously 20 cc. Patient is anxious to have this drain removed. Discussed with Dr. Earleen [name] - will repeat CT abd/pelvis with contrast today to re-assess abscess.   Continue current management. Will follow up with patient after CT is resulted to discuss further plans.   Please call IR with questions or concerns.  Electronically Signed: Joaquim Nam, PA-C 12/11/2018, 8:07 AM   I spent a total of 15 Minutes at the the patient's bedside AND on the patient's [name] floor or unit, greater than 50% of which was counseling/coordinating care for right chest tube and right perinephritic abscess drain follow up.

## 2018-12-11 NOTE — Progress Notes (Signed)
Pt resting in bed, flossing his teeth. Discussed plans for CT abdomen/pelvis to evaluate drain. Pt in agreement. Pt refused mupirocin ointment to nares as he states he rinses his nose too often and it would just come out. No drainage noted in right sided drain, chest tube also in place.

## 2018-12-12 ENCOUNTER — Inpatient Hospital Stay (HOSPITAL_COMMUNITY): Payer: BC Managed Care – PPO

## 2018-12-12 DIAGNOSIS — J869 Pyothorax without fistula: Secondary | ICD-10-CM

## 2018-12-12 LAB — CBC
HCT: 33.1 % — ABNORMAL LOW (ref 39.0–52.0)
Hemoglobin: 10.8 g/dL — ABNORMAL LOW (ref 13.0–17.0)
MCH: 27.4 pg (ref 26.0–34.0)
MCHC: 32.6 g/dL (ref 30.0–36.0)
MCV: 84 fL (ref 80.0–100.0)
Platelets: 608 10*3/uL — ABNORMAL HIGH (ref 150–400)
RBC: 3.94 MIL/uL — ABNORMAL LOW (ref 4.22–5.81)
RDW: 15.8 % — ABNORMAL HIGH (ref 11.5–15.5)
WBC: 8.4 10*3/uL (ref 4.0–10.5)
nRBC: 0 % (ref 0.0–0.2)

## 2018-12-12 LAB — BASIC METABOLIC PANEL
Anion gap: 11 (ref 5–15)
BUN: 16 mg/dL (ref 8–23)
CO2: 25 mmol/L (ref 22–32)
Calcium: 8.8 mg/dL — ABNORMAL LOW (ref 8.9–10.3)
Chloride: 96 mmol/L — ABNORMAL LOW (ref 98–111)
Creatinine, Ser: 1.1 mg/dL (ref 0.61–1.24)
GFR calc Af Amer: >60 mL/min (ref >60)
GFR calc non Af Amer: >60 mL/min (ref >60)
Glucose, Bld: 98 mg/dL (ref 70–99)
Potassium: 4 mmol/L (ref 3.5–5.1)
Sodium: 132 mmol/L — ABNORMAL LOW (ref 135–145)

## 2018-12-12 NOTE — Progress Notes (Signed)
     [address]       [place] [address]             [phone]       Doing well.  Frustrated with hospital stay  Today's Vitals   12/11/18 1430 12/11/18 2111 12/12/18 0305 12/12/18 0531  BP:  126/79  130/80  Pulse:  76  64  Resp:  18  18  Temp:  (!) 97.5 F (36.4 C)  97.6 F (36.4 C)  TempSrc:      SpO2:  100%  99%  Weight:      Height:      PainSc: 0-No pain 0-No pain Asleep    Body mass index is 21.42 kg/m.  Alert NAD RRR EWOB.  Minimal output from CT  I/O last 3 completed shifts: In: 49 [P.O.:790; Other:15] Out: 10 [Drains:10]   On CT the pleural collection is much improved.  The space is almost obliterated.  He likely will not require surgical drainage at this point, and the chest drain can be removed.  He still requires source control for the perinephric abscess, and interval scans to make sure that the pleural collection does not re-accumulate. In regards to the LLL cavitary lesion, and pulmonary nodules, this can be worked up as an outpatient.     - Ok with chest tube removal, but will defer to IR since it was originally placed by them.   - Will not need surgery thoracic this admission given good response - will need continued surveillance of the chest for re-accumulation, and the left sided disease.  Kevin [name] Kevin [name]

## 2018-12-12 NOTE — Progress Notes (Signed)
PROGRESS NOTE    AFNAN EMBERTON  XBM:[id number] DOB: 07/19/1955 DOA: 12/02/2018 PCP: Berkley Harvey, NP   Brief Narrative:  [name] a 63 y.o.malewith medical history significant ofhead and neck cancer status post radiation, hypertension, hypothyroidism, pulmonary nodule presenting to the hospital for evaluation of cough, abnormal labs, and abnormal chest x-ray. Patient states he has been coughing for the past 2 days. He previously had slight discomfort in 1 of his ribs on the right side. Denies any left-sided or substernal chest pain/pressure. Denies any injuries to his ribs. States he went to his PCPs office yesterday and was told his white blood cell count was high on labs. He was also told that his chest x-ray showed an infection. He was advised to come into the ED for further evaluation. Denies any fevers or chills. Reports feeling slightly short of breath with exertion for the past few days. Denies any nausea, vomiting, abdominal pain, or diarrhea. No other complaints.  Chest CT showing a large loculated gas and fluid collection within the right lateral and posterior thorax abutting the pleural surface, consistent with a large empyema measuring approximately 10 x 5.3 x 14.9 cm and is contiguous with a smaller loculated fluid collection at the right lung base. Patient received vancomycin, cefepime, and 1 L IV fluid bolus in the ED. Interventional radiology consulted for CT-guided chest tube placement done on 12/04/18.    Interventional radiology consulted for right perinephric abscess drainage. Completed on 12/06/18.  Consultants:   CT surgery  IR  Procedures:   CT-guided chest tube placement on 12/04/2018.  Right perinephric abscess drainage placed on 12/06/2018  Antimicrobials:   Vancomycin/Zosyn switched to cefazolin on 12/08/2018   Subjective: Patient seen and examined.  Remains frustrated with extended length of stay however better than yesterday.  Does  not have any other complaint.  Wants to go home as soon as possible.    LOS: 9 days   Assessment & Plan:   Principal Problem:   Empyema ([place]) Active Problems:   Hypothyroid   Pulmonary nodule   Anemia   Hyponatremia  Large R empyema 2/2 MSSA Presenting from his PCP office for leukocytosis with complaints of right-sided rib pain and persistent cough.  Patient was found to have a large right-sided empyema.  Patient underwent chest tube insertion by IR on 12/04/2018.  No malignancy noted on cytology.  Etiology likely secondary to right perinephric abscess.  Continues to have chest tube to low wall suction.  Managed by CT surgery/IR.  Continue cefazolin.  He is comfortable on room air.  Per CT surgery note today, he does not need any VATS anymore.  Right Perinephric abscess  CT abdomen/pelvis on 12/03/2018 notable for right posterior perinephric space abscess measuring 7.7 x 5.8 x 8.3 cm which is also appreciated on MRI abdomen/pelvis.  Underwent CT-guided right drain placement on 12/06/2018 by IR.  Leukocytosis resolved.  He is afebrile..  Minimal to no drainage from the drain.  Repeat CT scan yesterday showed improvement of the abscess however IR plans to continue with the drain.  Management deferred to IR.  Continue cefazolin.  Thrombocythemia:  Platelets on presentation greater than 600K, could be reactive --Continue Lovenox DVT prophylaxis  Hx of oropharyngeal cancer --Follow up outpatient with oncology  Severe protein calorie malnutrition Albumin 1.9, BMI 21 --Dietitian consult  DVT prophylaxis: Subcu Lovenox daily Code Status: Full code Family Communication: None Disposition Plan: Discharge to home when cleared by IR. Consults called:IR, cardiothoracic surgery  Objective: Vitals:   12/11/18 0558 12/11/18 1400 12/11/18 2111 12/12/18 0531  BP: 113/70 134/78 126/79 130/80  Pulse: 66 73 76 64  Resp:  18 18 18   Temp: (!) 97.5 F (36.4 C) (!) 97.5 F (36.4 C) (!)  97.5 F (36.4 C) 97.6 F (36.4 C)  TempSrc:      SpO2: 98% 100% 100% 99%  Weight:      Height:        Intake/Output Summary (Last 24 hours) at 12/12/2018 1114 Last data filed at 12/12/2018 0953 Gross per 24 hour  Intake 590 ml  Output 20 ml  Net 570 ml   Filed Weights   12/03/18 0215 12/03/18 1659  Weight: 59 kg 60.2 kg    Examination:  General exam: Appears calm and comfortable  Respiratory system: Clear to auscultation. Respiratory effort normal.  Chest tube in right posterior thoracic area present. Cardiovascular system: S1 & S2 heard, RRR. No JVD, murmurs, rubs, gallops or clicks. No pedal edema. Gastrointestinal system: Abdomen is nondistended, soft and nontender. No organomegaly or masses felt. Normal bowel sounds heard.  Perinephric drain in the right flank area present. Central nervous system: Alert and oriented. No focal neurological deficits. Extremities: Symmetric 5 x 5 power. Skin: No rashes, lesions or ulcers Psychiatry: Judgement and insight appear normal. Mood & affect appropriate.    Data Reviewed: I have personally reviewed following labs and imaging studies  CBC: Recent Labs  Lab 12/08/18 0207 12/09/18 0223 12/10/18 0229 12/11/18 0329 12/12/18 0419  WBC 11.5* 10.4 11.4* 10.5 8.4  HGB 10.1* 10.4* 10.4* 10.8* 10.8*  HCT 31.2* 31.3* 32.2* 33.1* 33.1*  MCV 81.7 81.3 83.0 82.5 84.0  PLT 628* 626* 648* 620* 027*   Basic Metabolic Panel: Recent Labs  Lab 12/08/18 0207 12/09/18 0223 12/10/18 0229 12/11/18 0329 12/12/18 0419  NA 135 133* 135 135 132*  K 4.6 4.0 4.2 4.3 4.0  CL 100 98 98 98 96*  CO2 26 25 25 26 25   GLUCOSE 93 97 91 96 98  BUN 11 13 15 13 16   CREATININE 1.22 1.10 1.15 1.00 1.10  CALCIUM 8.7* 8.6* 9.0 9.1 8.8*   GFR: Estimated Creatinine Clearance: 60 mL/min (by C-G formula based on SCr of 1.1 mg/dL). Liver Function Tests: No results for input(s): AST, ALT, ALKPHOS, BILITOT, PROT, ALBUMIN in the last 168 hours. No results for  input(s): LIPASE, AMYLASE in the last 168 hours. No results for input(s): AMMONIA in the last 168 hours. Coagulation Profile: No results for input(s): INR, PROTIME in the last 168 hours. Cardiac Enzymes: No results for input(s): CKTOTAL, CKMB, CKMBINDEX, TROPONINI in the last 168 hours. BNP (last 3 results) No results for input(s): PROBNP in the last 8760 hours. HbA1C: No results for input(s): HGBA1C in the last 72 hours. CBG: No results for input(s): GLUCAP in the last 168 hours. Lipid Profile: No results for input(s): CHOL, HDL, LDLCALC, TRIG, CHOLHDL, LDLDIRECT in the last 72 hours. Thyroid Function Tests: No results for input(s): TSH, T4TOTAL, FREET4, T3FREE, THYROIDAB in the last 72 hours. Anemia Panel: No results for input(s): VITAMINB12, FOLATE, FERRITIN, TIBC, IRON, RETICCTPCT in the last 72 hours. Sepsis Labs: No results for input(s): PROCALCITON, LATICACIDVEN in the last 168 hours.  Recent Results (from the past 240 hour(s))  Blood culture (routine x 2)     Status: None   Collection Time: 12/02/18 11:29 PM   Specimen: BLOOD  Result Value Ref Range Status   Specimen Description BLOOD RIGHT ANTECUBITAL  Final  Special Requests   Final    BOTTLES DRAWN AEROBIC AND ANAEROBIC Blood Culture results may not be optimal due to an excessive volume of blood received in culture bottles   Culture   Final    NO GROWTH 5 DAYS Performed at [facility], [place] [address], [place], [place] [address]    Report Status 12/08/2018 FINAL  Final  Blood culture (routine x 2)     Status: None   Collection Time: 12/02/18 11:38 PM   Specimen: BLOOD  Result Value Ref Range Status   Specimen Description BLOOD LEFT ANTECUBITAL  Final   Special Requests   Final    BOTTLES DRAWN AEROBIC AND ANAEROBIC Blood Culture results may not be optimal due to an excessive volume of blood received in culture bottles   Culture   Final    NO GROWTH 5 DAYS Performed at [facility], [place] [address] [place], [place] [address]    Report Status 12/08/2018 FINAL  Final  SARS Coronavirus 2 (CEPHEID- Performed in [facility] hospital lab), Hosp Order     Status: None   Collection Time: 12/03/18  1:04 AM   Specimen: Nasopharyngeal Swab  Result Value Ref Range Status   SARS Coronavirus 2 NEGATIVE NEGATIVE Final    Comment: (NOTE) If result is NEGATIVE SARS-CoV-2 target nucleic acids are NOT DETECTED. The SARS-CoV-2 RNA is generally detectable in upper and lower  respiratory specimens during the acute phase of infection. The lowest  concentration of SARS-CoV-2 viral copies this assay can detect is 250  copies / mL. A negative result does not preclude SARS-CoV-2 infection  and should not be used as the sole basis for treatment or other  patient management decisions.  A negative result may occur with  improper specimen collection / handling, submission of specimen other  than nasopharyngeal swab, presence of viral mutation(s) within the  areas targeted by this assay, and inadequate number of viral copies  (<250 copies / mL). A negative result must be combined with clinical  observations, patient history, and epidemiological information. If result is POSITIVE SARS-CoV-2 target nucleic acids are DETECTED. The SARS-CoV-2 RNA is generally detectable in upper and lower  respiratory specimens dur ing the acute phase of infection.  Positive  results are indicative of active infection with SARS-CoV-2.  Clinical  correlation with patient history and other diagnostic information is  necessary to determine patient infection status.  Positive results do  not rule out bacterial infection or co-infection with other viruses. If result is PRESUMPTIVE POSTIVE SARS-CoV-2 nucleic acids MAY BE PRESENT.   A presumptive positive result was obtained on the submitted specimen  and confirmed on repeat testing.  While 2019 novel coronavirus  (SARS-CoV-2) nucleic acids may be present in the submitted sample   additional confirmatory testing may be necessary for epidemiological  and / or clinical management purposes  to differentiate between  SARS-CoV-2 and other Sarbecovirus currently known to infect humans.  If clinically indicated additional testing with an alternate test  methodology [phone]) is advised. The SARS-CoV-2 RNA is generally  detectable in upper and lower respiratory sp ecimens during the acute  phase of infection. The expected result is Negative. Fact Sheet for Patients:  StrictlyIdeas.no Fact Sheet for Healthcare Providers: BankingDealers.co.za This test is not yet approved or cleared by the [place] FDA and has been authorized for detection and/or diagnosis of SARS-CoV-2 by FDA under an Emergency Use Authorization (EUA).  This EUA will remain in effect (meaning  this test can be used) for the duration of the COVID-19 declaration under Section 564(b)(1) of the Act, 21 U.S.C. section 360bbb-3(b)(1), unless the authorization is terminated or revoked sooner. Performed at [facility], [place] [address], [place], [place] [address]   Aerobic/Anaerobic Culture (surgical/deep wound)     Status: None   Collection Time: 12/06/18 10:00 AM   Specimen: Abscess  Result Value Ref Range Status   Specimen Description ABSCESS  Final   Special Requests NONE  Final   Gram Stain   Final    ABUNDANT WBC PRESENT, PREDOMINANTLY PMN MODERATE GRAM POSITIVE COCCI IN PAIRS IN CLUSTERS    Culture   Final    MODERATE STAPHYLOCOCCUS AUREUS NO ANAEROBES ISOLATED Performed at [facility], [place] [address] [place], [place] [address]    Report Status 12/11/2018 FINAL  Final   Organism ID, Bacteria STAPHYLOCOCCUS AUREUS  Final      Susceptibility   Staphylococcus aureus - MIC*    CIPROFLOXACIN <=0.5 SENSITIVE Sensitive     ERYTHROMYCIN <=0.25 SENSITIVE Sensitive     GENTAMICIN <=0.5 SENSITIVE Sensitive     OXACILLIN 0.5 SENSITIVE  Sensitive     TETRACYCLINE <=1 SENSITIVE Sensitive     VANCOMYCIN <=0.5 SENSITIVE Sensitive     TRIMETH/SULFA <=10 SENSITIVE Sensitive     CLINDAMYCIN <=0.25 SENSITIVE Sensitive     RIFAMPIN <=0.5 SENSITIVE Sensitive     Inducible Clindamycin NEGATIVE Sensitive     * MODERATE STAPHYLOCOCCUS AUREUS      Radiology Studies: Ct Abdomen Pelvis W Contrast  Result Date: 12/11/2018 CLINICAL DATA:  Follow-up perinephritic abscess status post drain placement, decreased output EXAM: CT ABDOMEN AND PELVIS WITH CONTRAST TECHNIQUE: Multidetector CT imaging of the abdomen and pelvis was performed using the standard protocol following bolus administration of intravenous contrast. CONTRAST:  112mL ISOVUE-300 IOPAMIDOL (ISOVUE-300) INJECTION 61% COMPARISON:  CT-guided drain placement, 12/04/2018, CT abdomen pelvis, 12/03/2018 FINDINGS: Lower chest: There has been interval reduction in size of an air and fluid collection about the right lower lobe, partially imaged (series 4, image 1). There is a new cavitary lesion of the left lung base measuring 2.2 x 1.8 cm (series 4, image 13). There are numerous small pulmonary nodules of the included bilateral lung bases, new and increased compared to prior examination. Hepatobiliary: No solid liver abnormality is seen. No gallstones, gallbladder wall thickening, or biliary dilatation. Pancreas: Unremarkable. No pancreatic ductal dilatation or surrounding inflammatory changes. Spleen: Normal in size without significant abnormality. Adrenals/Urinary Tract: Adrenal glands are unremarkable. There has been interval placement of a pigtail drainage catheter within a high attenuation perinephric collection posterior to the right kidney, which measures 6.4 x 4.0 cm, slightly decreased in size when compared to prior examination at which time it measured 7.7 x 5.8 cm when measured similarly (series 3, image 32). Bladder is unremarkable. Stomach/Bowel: Stomach is within normal limits. Appendix  appears normal. No evidence of bowel wall thickening, distention, or inflammatory changes. Vascular/Lymphatic: Aortic atherosclerosis. No enlarged abdominal or pelvic lymph nodes. Reproductive: Prostatomegaly. Other: No abdominal wall hernia or abnormality. No abdominopelvic ascites. Musculoskeletal: No acute or significant osseous findings. IMPRESSION: 1. There has been interval placement of a pigtail drainage catheter within a high attenuation perinephric collection posterior to the right kidney, which measures 6.4 x 4.0 cm, slightly decreased in size when compared to prior examination at which time it measured 7.7 x 5.8 cm when measured similarly (series 3, image 32). The extent of persistent fluid versus phlegmon in  this collection is unclear. 2. There has been interval reduction in size of an air and fluid collection about the right lower lobe, partially imaged (series 4, image 1). There is a new cavitary lesion of the left lung base measuring 2.2 x 1.8 cm (series 4, image 13). There are numerous small pulmonary nodules of the included bilateral lung bases, new and increased compared to prior examination. Constellation of findings within the lungs are concerning for aspiration and/or septic embolism. Consider dedicated CT examination of the chest. Electronically Signed   By: Eddie Candle M.D.   On: 12/11/2018 13:55    Scheduled Meds: . amLODipine  10 mg Oral Daily  . Chlorhexidine Gluconate Cloth  6 each Topical Daily  . enoxaparin (LOVENOX) injection  40 mg Subcutaneous Q24H  . levothyroxine  75 mcg Oral QAC breakfast  . multivitamin with minerals  1 tablet Oral Daily  . mupirocin ointment  1 application Nasal BID  . sodium chloride flush  3 mL Intravenous Once  . sodium chloride flush  5 mL Intracatheter Q8H   Continuous Infusions: .  ceFAZolin (ANCEF) IV 2 g (12/12/18 0617)    Time spent: 25 minutes  Darliss Cheney, MD [facility] Pager [phone]  If 7PM-7AM, please contact  night-coverage [email or web address] Password [email or web address] 12/12/2018, 11:14 AM

## 2018-12-12 NOTE — Progress Notes (Addendum)
Referring Physician(s): Kayleen Memos  Supervising Physician: Markus Daft  Patient Status:  [facility] - In-pt  Chief Complaint: "Chest tube pain"  Subjective:  Right empyema s/p right chest tube placement (20 Fr) in IR 12/04/2018 by Dr. Laurence Ferrari. Right retroperitoneal/perinephric abscess s/p right perinephric drain placement in IR 12/06/2018 by Dr. Vernard Gambles. Patient awake and alert sitting in chair. He is frustrated with hospital stay and complains of tenderness at chest tube incision site- he is excited this is to be removed today. Right chest tube site c/d/i. Perinephric drain site c/d/i.   Allergies: Patient has no known allergies.  Medications: Prior to Admission medications   Medication Sig Start Date End Date Taking? Authorizing Provider  amLODipine (NORVASC) 10 MG tablet Take 10 mg by mouth daily.  05/22/16  Yes [provider]  levothyroxine (SYNTHROID) 75 MCG tablet Take 75 mcg by mouth daily before breakfast.   Yes [provider]     Vital Signs: BP 130/80 (BP Location: Left Arm)    Pulse 64    Temp 97.6 F (36.4 C)    Resp 18    Ht 5\' 6"  (1.676 m)    Wt 132 lb 11.5 oz (60.2 kg)    SpO2 99%    BMI 21.42 kg/m   Physical Exam Vitals signs and nursing note reviewed.  Constitutional:      General: He is not in acute distress.    Appearance: Normal appearance.  Cardiovascular:     Comments: Right chest tube site with mild tenderness, no erythema, drainage, or active bleeding. Pulmonary:     Effort: Pulmonary effort is normal. No respiratory distress.  Abdominal:     Comments: Right perinephric drain site without tenderness, erythema, drainage, or active bleeding; minimal output of purulent fluid in gravity bag (chart check reveals 20 cc output this AM).  Skin:    General: Skin is warm and dry.  Neurological:     Mental Status: He is alert and oriented to person, place, and time.  Psychiatric:        Mood and Affect: Mood normal.        Behavior:  Behavior normal.        Thought Content: Thought content normal.        Judgment: Judgment normal.     Imaging: Ct Abdomen Pelvis W Contrast  Result Date: 12/11/2018 CLINICAL DATA:  Follow-up perinephritic abscess status post drain placement, decreased output EXAM: CT ABDOMEN AND PELVIS WITH CONTRAST TECHNIQUE: Multidetector CT imaging of the abdomen and pelvis was performed using the standard protocol following bolus administration of intravenous contrast. CONTRAST:  164mL ISOVUE-300 IOPAMIDOL (ISOVUE-300) INJECTION 61% COMPARISON:  CT-guided drain placement, 12/04/2018, CT abdomen pelvis, 12/03/2018 FINDINGS: Lower chest: There has been interval reduction in size of an air and fluid collection about the right lower lobe, partially imaged (series 4, image 1). There is a new cavitary lesion of the left lung base measuring 2.2 x 1.8 cm (series 4, image 13). There are numerous small pulmonary nodules of the included bilateral lung bases, new and increased compared to prior examination. Hepatobiliary: No solid liver abnormality is seen. No gallstones, gallbladder wall thickening, or biliary dilatation. Pancreas: Unremarkable. No pancreatic ductal dilatation or surrounding inflammatory changes. Spleen: Normal in size without significant abnormality. Adrenals/Urinary Tract: Adrenal glands are unremarkable. There has been interval placement of a pigtail drainage catheter within a high attenuation perinephric collection posterior to the right kidney, which measures 6.4 x 4.0 cm, slightly decreased in size  when compared to prior examination at which time it measured 7.7 x 5.8 cm when measured similarly (series 3, image 32). Bladder is unremarkable. Stomach/Bowel: Stomach is within normal limits. Appendix appears normal. No evidence of bowel wall thickening, distention, or inflammatory changes. Vascular/Lymphatic: Aortic atherosclerosis. No enlarged abdominal or pelvic lymph nodes. Reproductive: Prostatomegaly.  Other: No abdominal wall hernia or abnormality. No abdominopelvic ascites. Musculoskeletal: No acute or significant osseous findings. IMPRESSION: 1. There has been interval placement of a pigtail drainage catheter within a high attenuation perinephric collection posterior to the right kidney, which measures 6.4 x 4.0 cm, slightly decreased in size when compared to prior examination at which time it measured 7.7 x 5.8 cm when measured similarly (series 3, image 32). The extent of persistent fluid versus phlegmon in this collection is unclear. 2. There has been interval reduction in size of an air and fluid collection about the right lower lobe, partially imaged (series 4, image 1). There is a new cavitary lesion of the left lung base measuring 2.2 x 1.8 cm (series 4, image 13). There are numerous small pulmonary nodules of the included bilateral lung bases, new and increased compared to prior examination. Constellation of findings within the lungs are concerning for aspiration and/or septic embolism. Consider dedicated CT examination of the chest. Electronically Signed   By: Eddie Candle M.D.   On: 12/11/2018 13:55   Dg Chest Port 1 View  Result Date: 12/09/2018 CLINICAL DATA:  Empyema.  Chest tube EXAM: PORTABLE CHEST 1 VIEW COMPARISON:  12/02/2018 FINDINGS: Interval placement of right chest tube coiled within the empyema. Near complete drainage of empyema. Small amount of residual pleural thickening on the right. No pneumothorax. Mild airspace disease in the right base Left lung clear. IMPRESSION: Right chest tube in place with satisfactory drainage of empyema. No pneumothorax. Electronically Signed   By: Franchot Gallo M.D.   On: 12/09/2018 12:58    Labs:  CBC: Recent Labs    12/09/18 0223 12/10/18 0229 12/11/18 0329 12/12/18 0419  WBC 10.4 11.4* 10.5 8.4  HGB 10.4* 10.4* 10.8* 10.8*  HCT 31.3* 32.2* 33.1* 33.1*  PLT 626* 648* 620* 608*    COAGS: Recent Labs    12/04/18 0316  INR 1.3*     BMP: Recent Labs    12/09/18 0223 12/10/18 0229 12/11/18 0329 12/12/18 0419  NA 133* 135 135 132*  K 4.0 4.2 4.3 4.0  CL 98 98 98 96*  CO2 25 25 26 25   GLUCOSE 97 91 96 98  BUN 13 15 13 16   CALCIUM 8.6* 9.0 9.1 8.8*  CREATININE 1.10 1.15 1.00 1.10  GFRNONAA >60 >60 >60 >60  GFRAA >60 >60 >60 >60    LIVER FUNCTION TESTS: Recent Labs    12/02/18 1800 12/05/18 0812  BILITOT 0.3 0.5  AST 21 16  ALT 17 18  ALKPHOS 136* 122  PROT 6.3* 6.3*  ALBUMIN 2.0* 1.9*    Assessment and Plan:  Right empyema s/p right chest tube placement (20 Fr) in IR 12/04/2018 by Dr. Laurence Ferrari. Right chest tube site stable. CT revealed empyema much improved with space almost obliterated. TCTS recommended chest tube removal, discussed with Dr. Anselm Pancoast who also recommends chest tube removal. Right chest tube removed beside without difficulty (EBL = 0 mL, no complications). Site was dressed with pressure dressing (vasaline gauze and gauze). Post-removal CXR ordered. Barnetta Chapel, RN aware.  Right retroperitoneal/perinephric abscess s/p right perinephric drain placement in IR 12/06/2018 by Dr. Vernard Gambles. Perinephric drain stable with  minimal output in gravity bag (chart check reveals 20 cc output this AM). CT reviewed by Dr. Anselm Pancoast who states tube in good position and abscess not resolved- recommend tube stay in place at this time. Continue current drain management- continue with Qshift flushes/monitor of output. Further plans per [facility]- appreciate and agree with management. IR to follow.   Addendum: Post-procedure CXR revealed no recurrent pneumothorax.   Electronically Signed: Earley Abide, PA-C 12/12/2018, 11:15 AM   I spent a total of 35 Minutes at the the patient's bedside AND on the patient's hospital floor or unit, greater than 50% of which was counseling/coordinating care for right empyema s/p chest tube placement AND right perinephric abscess s/p drain placement.

## 2018-12-12 NOTE — Progress Notes (Signed)
Paged interventional radiology per pt request.  Right flank drain flushed per order - 54mL sterile saline, flushed easily without pain, immediate return of thin, serous, pink tinged fluid. I emptied 20 mL out of drain. Pt reports this is only what was instilled by night shift and myself (no recorded input on night shift).  Pt given prn Tylenol - having some pain when he moves sometimes the chest tube site hurts.  Pt frustrated with length of hospital stay, eager to go home.

## 2018-12-13 DIAGNOSIS — N151 Renal and perinephric abscess: Secondary | ICD-10-CM | POA: Diagnosis present

## 2018-12-13 MED ORDER — CEPHALEXIN 500 MG PO CAPS: 500.0000 mg | ORAL_CAPSULE | Freq: Four times a day (QID) | ORAL | 0 refills | Status: AC

## 2018-12-13 NOTE — Progress Notes (Signed)
IR.  Patient with history of right retroperitoneal/perinephric abscess s/p right perinephric drain placement in IR 12/06/2018 by Dr. Vernard Gambles.  Discharge instructions: - Flush drain once daily with 10 cc NS flush. - Record drain output once daily. - F/U in drain clinic 2 weeks after discharge (order placed to facilitate this).  Alvis Lemmings, RN aware to teach patient how to manage drain.  Please call IR with questions/concerns.   Bea Graff Talon Witting, PA-C 12/13/2018, 10:16 AM

## 2018-12-13 NOTE — Progress Notes (Signed)
Patient was discharged home by MD order; discharged instructions review and give to patient with care notes; patient was educated how to flush the drain and change dressing. He was encouraged to record drain output daily; patient verbalized understanding; IV DIC; patient will be escorted to the car by nurse tech via wheelchair.

## 2018-12-13 NOTE — Discharge Summary (Signed)
Physician Discharge Summary  Kevin [name] PTW:[id number] DOB: 31-Jan-1956 DOA: 12/02/2018  PCP: Berkley Harvey, NP  Admit date: 12/02/2018 Discharge date: 12/13/2018  Admitted From: Home Disposition: Home  Recommendations for Outpatient Follow-up:  1. Follow up with PCP in 1-2 weeks  2. Follow-up with radiology in 1 to 2 weeks. 3. Please obtain BMP/CBC in one week 4. Please follow up on the following pending results:  Home Health: None Equipment/Devices: None  Discharge Condition: Stable CODE STATUS: Full code Diet recommendation: Cardiac  Subjective: Patient seen and examined.  He has no complaints.  Denied any shortness of breath or any abdominal pain.  He is eager to leave.  He does not want to spend any determinate in the hospital.  He actually threatened that he would sign AMA papers if we did not discharge him today.  Brief/Interim Summary: Kevin Altamura Lundeenis a 63 y.o.malewith medical history significant ofhead and neck cancer status post radiation, hypertension, hypothyroidism, pulmonary nodule presented for evaluation of cough, abnormal labs, and abnormal chest x-ray. Denied any left-sided or substernal chest pain/pressure or any injuries to his ribs. States he went to his PCPs office day before presenting and was told his white blood cell count was high on labs. He was also told that his chest x-ray showed an infection. He was advised to come into the ED for further evaluation. Denied any fevers or chills. Reported feeling slightly short of breath with exertion for the past few days.   Chest CT showing a large loculated gas and fluid collection within the right lateral and posterior thorax abutting the pleural surface, consistent with a large empyema measuring approximately 10 x 5.3 x 14.9 cm and is contiguous with a smaller loculated fluid collection at the right lung base. Patient received vancomycin, cefepime, and 1 L IV fluid bolus in the ED. Interventional radiology  consulted for CT-guided chest tube placement which was done on 12/04/18. CT chest also indicated possibility of abdominal abscess for which dedicated CT abdomen pelvis was done which showed perinephric abscess for which patient underwent perinephric drain placement by IR on 12/06/2018.  He was initially started on Zosyn and vancomycin empirically.  Abscess culture grew MSSA.  He was switched to cefazolin.  Received total of 6 days of antibiotics.  Due to empyema, CT surgery was consulted.  Initial plan was to do VATS at some point in time during this hospitalization once his perinephric drain is out however repeat chest CT showed significant clearing of his chest from the empyema so CT surgery change of plan and advised no VATS at this point in time.  There was no pneumothorax on chest x-ray done post chest tube removal.  Patient was very eager to go home.  He wanted to sign AMA papers and leave.  I talked to the IR and spoke to Dr. Earleen Newport who was okay with patient going home and they will follow-up with him in 2 weeks and manage the drain.  Patient being discharged on 4 more days of oral Keflex per pharmacy recommendations.  He will continue rest of his home medications.  I did explain this to the patient in detail.  Although he is not happy to go home with the drain but he is in agreement with this plan.  Discharge Diagnoses:  Principal Problem:   [facility]) Active Problems:   Hypothyroid   Pulmonary nodule   Anemia   Hyponatremia   Perinephric abscess    Discharge Instructions  Discharge Instructions  Discharge patient   Complete by: As directed    Discharge disposition: 01-Home or Self Care   Discharge patient date: 12/13/2018     Allergies as of 12/13/2018   No Known Allergies     Medication List    TAKE these medications   amLODipine 10 MG tablet Commonly known as: NORVASC Take 10 mg by mouth daily.   cephALEXin 500 MG capsule Commonly known as: KEFLEX Take 1 capsule (500 mg  total) by mouth 4 (four) times daily for 4 days.   levothyroxine 75 MCG tablet Commonly known as: SYNTHROID Take 75 mcg by mouth daily before breakfast.      Follow-up Information    Eldridge Abrahams L, NP Follow up in 1 week(s).   Specialty: Nurse Practitioner Contact information: [place] [id number] [phone]          No Known Allergies  Consultations: IR/CT surgery   Procedures/Studies: Ct Abdomen Pelvis Wo Contrast  Result Date: 12/03/2018 CLINICAL DATA:  Cough and leukocytosis. 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 Abnormal chest CT. EXAM: CT ABDOMEN AND PELVIS WITHOUT CONTRAST TECHNIQUE: Multidetector CT imaging of the abdomen and pelvis was performed following the standard protocol without IV contrast. COMPARISON:  PET-CT 08/22/2010.  Chest CT earlier today. FINDINGS: Lower chest: Loculated air-fluid collection laterally in the right inferior hemithorax is unchanged from the earlier chest CT, measuring up to 11.4 x 5.2 cm transverse on image 5/3 and consistent with empyema. There is underlying centrally cavitary airspace disease in the adjacent right lower lobe as well as compressive atelectasis. Minimal subpleural nodularity is present at the left lung base (image 18/3). No significant left pleural effusion. Hepatobiliary: The liver appears unremarkable as imaged in the noncontrast state. There is high-density material within the gallbladder lumen but no evidence of gallstones, gallbladder wall thickening or biliary dilatation. Pancreas: Unremarkable. No pancreatic ductal dilatation or surrounding inflammatory changes. Spleen: Normal in size without focal abnormality. Adrenals/Urinary Tract: Both adrenal glands appear normal. Retroperitoneal mass involving the right posterior perinephric space is completely imaged on this study, measuring 7.7 x 5.8 cm transverse on image 38/2. This extends 8.3 cm cephalad caudal on image 45/6. This  mass abuts and deforms the posterior aspect of the right kidney which is displaced anteriorly. There are internal locules of fat within this lesion which otherwise measures soft tissue and fluid density. The lesion is incompletely characterized on this noncontrast study. The left kidney appears normal. No evidence of urinary tract calculus or hydronephrosis. There is contrast within the urinary bladder from the earlier chest CT. Stomach/Bowel: No evidence of bowel wall thickening, distention or surrounding inflammatory change. Vascular/Lymphatic: There are no enlarged abdominal or pelvic lymph nodes. There are small retroperitoneal lymph nodes. Aortic and branch vessel atherosclerosis. Reproductive: Mild enlargement of the prostate gland. Other: A small amount of free pelvic fluid is present. There is mild edema throughout the deep and subcutaneous fat consistent with anasarca. No other focal fluid collections are identified. Musculoskeletal: No acute or significant osseous findings. Stable sclerotic lesion medially in the left iliac bone. IMPRESSION: 1. The mass posterior to the right kidney seen on previous chest CT is not further characterized by this noncontrast CT, although is now fully imaged. Based on central low density and appearance on earlier CT, this appears necrotic and could reflect a retroperitoneal abscess based on the chest findings. Necrotic tumor and subacute hematoma less likely. 2. No other significant findings identified within the  abdomen or pelvis. 3. Complex air-fluid collection laterally in the right hemithorax again noted consistent with empyema. Airspace opacities at both lung bases. Electronically Signed   By: Richardean Sale M.D.   On: 12/03/2018 10:35   Ct Chest W Contrast  Result Date: 12/03/2018 CLINICAL DATA:  Right-sided chest pain persistent EXAM: CT CHEST WITH CONTRAST TECHNIQUE: Multidetector CT imaging of the chest was performed during intravenous contrast administration.  CONTRAST:  61mL OMNIPAQUE IOHEXOL 300 MG/ML  SOLN COMPARISON:  Chest x-ray 12/02/2018, 06/13/2017 FINDINGS: Cardiovascular: Aneurysmal dilatation of the ascending aorta, measuring up to 4 cm, no change. No dissection seen. Mild aortic atherosclerosis. Borderline heart size. No significant pericardial effusion Mediastinum/Nodes: Midline trachea. No thyroid mass. Low right paratracheal lymph node measures 12 mm. Subcarinal lymph node measures 10 mm. Esophagus within normal limits. Lungs/Pleura: Right posterolateral gas and fluid collection abutting the pleural surface with thick rind and non simple fluid, consistent with empyema. This measures approximately 10 cm oblique AP by 5.3 cm transverse by 14.9 cm cranial caudad. This is contiguous with a smaller loculated gas and fluid collection at the central right lung base. Biapical fibrosis. 9 mm right upper lobe pulmonary nodule, series 5, image number 29. Subpleural ground-glass nodules within the lateral left base, series 5, image number 98 and the posterior left lung base, series 5, image number 101. Upper Abdomen: Subcentimeter hypodensity within the left hepatic lobe, too small to further characterize. Incompletely visualized right retroperitoneal mass measuring 4.5 cm, appears posterior to the right kidney which appears displaced anteriorly. Musculoskeletal: No acute or suspicious osseous abnormality IMPRESSION: 1. Large loculated gas and fluid collection within the right lateral and posterior thorax abutting the pleural surface, consistent with a large empyema. This measures approximately 10 x 5.3 x 14.9 cm and is contiguous with a smaller loculated fluid collection at the right lung base. 2. Right upper lobe 9 mm pulmonary nodule. Small subpleural ground-glass nodules in the left lower lobe which may be infectious, inflammatory, or possibly metastatic. 3. Mild mediastinal adenopathy likely reactive 4. Incompletely visualized 4.5 cm right retroperitoneal mass  posterior to the right kidney. Dedicated abdominopelvic CT suggested for further evaluation. 5. Stable 4 cm ascending thoracic aortic aneurysm. Aortic Atherosclerosis (ICD10-I70.0).Aortic aneurysm NOS (ICD10-I71.9). Electronically Signed   By: Donavan Foil M.D.   On: 12/03/2018 01:54   Mr Abdomen W Wo Contrast  Result Date: 12/04/2018 CLINICAL DATA:  Right renal mass EXAM: MRI ABDOMEN WITHOUT AND WITH CONTRAST TECHNIQUE: Multiplanar multisequence MR imaging of the abdomen was performed both before and after the administration of intravenous contrast. CONTRAST:  6 mL Gadovist IV COMPARISON:  CT abdomen/pelvis dated 12/03/2018. CT chest dated 12/03/2018. FINDINGS: Lower chest: Pleural-based fluid/gas collection along the lateral right hemithorax, compatible with empyema, incompletely visualized. Hepatobiliary: Liver is within normal limits. Status post cholecystectomy. No intrahepatic or extrahepatic ductal dilatation. Pancreas:  Within normal limits. Spleen:  Within normal limits. Adrenals/Urinary Tract:  Adrenal glands are within normal limits. Complex cystic lesion with multiple enhancing septations and thick enhancing rim along the posterior right pararenal space, measuring 6.1 x 7.9 x 8.8 cm, corresponding to the CT abnormality. Associated restricted diffusion. No intrinsic hyperintensity on precontrast T1 imaging to suggest hemorrhage. No solid enhancing component. Lesion appears to arise from the posterior left lower kidney (series 15/image 61), favoring a perinephric abscess. 11 mm cyst in the medial left upper kidney (series 15/image 5). No hydronephrosis. Stomach/Bowel: Stomach is within normal limits. Visualized bowel is unremarkable. Vascular/Lymphatic:  No evidence of abdominal  aortic aneurysm. No suspicious abdominal lymphadenopathy. Other:  No abdominal ascites. Musculoskeletal: No focal osseous lesions. IMPRESSION: 8.8 cm abscess in the posterior right pararenal space, arising from the posterior  right lower kidney, corresponding to the CT abnormality. Right pleural empyema, incompletely visualized, better evaluated on CT chest. Electronically Signed   By: Julian Hy M.D.   On: 12/04/2018 00:31   Ct Abdomen Pelvis W Contrast  Result Date: 12/11/2018 CLINICAL DATA:  Follow-up perinephritic abscess status post drain placement, decreased output EXAM: CT ABDOMEN AND PELVIS WITH CONTRAST TECHNIQUE: Multidetector CT imaging of the abdomen and pelvis was performed using the standard protocol following bolus administration of intravenous contrast. CONTRAST:  148mL ISOVUE-300 IOPAMIDOL (ISOVUE-300) INJECTION 61% COMPARISON:  CT-guided drain placement, 12/04/2018, CT abdomen pelvis, 12/03/2018 FINDINGS: Lower chest: There has been interval reduction in size of an air and fluid collection about the right lower lobe, partially imaged (series 4, image 1). There is a new cavitary lesion of the left lung base measuring 2.2 x 1.8 cm (series 4, image 13). There are numerous small pulmonary nodules of the included bilateral lung bases, new and increased compared to prior examination. Hepatobiliary: No solid liver abnormality is seen. No gallstones, gallbladder wall thickening, or biliary dilatation. Pancreas: Unremarkable. No pancreatic ductal dilatation or surrounding inflammatory changes. Spleen: Normal in size without significant abnormality. Adrenals/Urinary Tract: Adrenal glands are unremarkable. There has been interval placement of a pigtail drainage catheter within a high attenuation perinephric collection posterior to the right kidney, which measures 6.4 x 4.0 cm, slightly decreased in size when compared to prior examination at which time it measured 7.7 x 5.8 cm when measured similarly (series 3, image 32). Bladder is unremarkable. Stomach/Bowel: Stomach is within normal limits. Appendix appears normal. No evidence of bowel wall thickening, distention, or inflammatory changes. Vascular/Lymphatic: Aortic  atherosclerosis. No enlarged abdominal or pelvic lymph nodes. Reproductive: Prostatomegaly. Other: No abdominal wall hernia or abnormality. No abdominopelvic ascites. Musculoskeletal: No acute or significant osseous findings. IMPRESSION: 1. There has been interval placement of a pigtail drainage catheter within a high attenuation perinephric collection posterior to the right kidney, which measures 6.4 x 4.0 cm, slightly decreased in size when compared to prior examination at which time it measured 7.7 x 5.8 cm when measured similarly (series 3, image 32). The extent of persistent fluid versus phlegmon in this collection is unclear. 2. There has been interval reduction in size of an air and fluid collection about the right lower lobe, partially imaged (series 4, image 1). There is a new cavitary lesion of the left lung base measuring 2.2 x 1.8 cm (series 4, image 13). There are numerous small pulmonary nodules of the included bilateral lung bases, new and increased compared to prior examination. Constellation of findings within the lungs are concerning for aspiration and/or septic embolism. Consider dedicated CT examination of the chest. Electronically Signed   By: Eddie Candle M.D.   On: 12/11/2018 13:55   Dg Chest Port 1 View  Result Date: 12/12/2018 CLINICAL DATA:  Right chest tube removal. EXAM: PORTABLE CHEST 1 VIEW COMPARISON:  Radiographs 12/09/2018 and 12/02/2018. FINDINGS: 1209 hours. The small caliber right chest tube is been removed in the interval. There is no recurrent pneumothorax. A small amount of loculated fluid is present laterally and superiorly in the right hemithorax. There is minimal peripheral opacity in the right lung. The left lung is clear. The heart size and mediastinal contours are stable. IMPRESSION: Interval right chest tube removal without recurrent pneumothorax. A  small amount of loculated pleural fluid remains in the right hemithorax. Electronically Signed   By: Richardean Sale  M.D.   On: 12/12/2018 12:25   Dg Chest Port 1 View  Result Date: 12/09/2018 CLINICAL DATA:  Empyema.  Chest tube EXAM: PORTABLE CHEST 1 VIEW COMPARISON:  12/02/2018 FINDINGS: Interval placement of right chest tube coiled within the empyema. Near complete drainage of empyema. Small amount of residual pleural thickening on the right. No pneumothorax. Mild airspace disease in the right base Left lung clear. IMPRESSION: Right chest tube in place with satisfactory drainage of empyema. No pneumothorax. Electronically Signed   By: Franchot Gallo M.D.   On: 12/09/2018 12:58   Ct Image Guided Drainage By Percutaneous Catheter  Result Date: 12/06/2018 CLINICAL DATA:  Empyema, post drainage. CT demonstrates complex right retroperitoneal/perinephric collection. MR suggests perinephric abscess. Drainage requested. EXAM: CT GUIDED DRAINAGE OF RIGHT RETROPERITONEAL ABSCESS ANESTHESIA/SEDATION: Intravenous Fentanyl 147mcg and Versed 2mg  were administered as conscious sedation during continuous monitoring of the patient's level of consciousness and physiological / cardiorespiratory status by the radiology RN, with a total moderate sedation time of 24 minutes. PROCEDURE: The procedure, risks, benefits, and alternatives were explained to the patient. Questions regarding the procedure were encouraged and answered. The patient understands and consents to the procedure. Patient placed prone. Select axial scans through the upper abdomen obtained. The retroperitoneal collection was localized, and an appropriate skin entry site was determined and marked. The operative field was prepped with chlorhexidinein a sterile fashion, and a sterile drape was applied covering the operative field. A sterile gown and sterile gloves were used for the procedure. Local anesthesia was provided with 1% Lidocaine. Under CT fluoroscopic guidance, a 19 gauge percutaneous entry needle advanced into the collection. Purulent material could be aspirated. An  Amplatz guidewire advanced easily within the collection, its position confirmed on CT. Tract was dilated to facilitate placement of a 12 French pigtail drain catheter, formed centrally within the collection. CT confirmed appropriate positioning. 20 mL of purulent aspirate sent for Gram stain and culture. The catheter was secured externally with 0 Prolene suture and StatLock and placed to gravity drain bag. The patient tolerated the procedure well. COMPLICATIONS: None immediate FINDINGS: Complex right retroperitoneal collection posterior to the kidney was localized. Purulent material was aspirated. 12 French drain catheter placed as above. Sample of the aspirate sent for Gram stain and culture. IMPRESSION: 1. Technically successful CT-guided right retroperitoneal drain catheter placement. Electronically Signed   By: Lucrezia Europe M.D.   On: 12/06/2018 10:38   Ct Perc Pleural Drain W/indwell Cath W/img Guide  Result Date: 12/04/2018 INDICATION: 63 year old male with loculated hydropneumothorax concerning for empyema. EXAM: CT-guided chest tube placement MEDICATIONS: The patient is currently admitted to the hospital and receiving intravenous antibiotics. The antibiotics were administered within an appropriate time frame prior to the initiation of the procedure. ANESTHESIA/SEDATION: Fentanyl 1 mcg IV; Versed 50 mg IV Moderate Sedation Time:  18 minutes The patient was continuously monitored during the procedure by the interventional radiology nurse under my direct supervision. COMPLICATIONS: None immediate. PROCEDURE: Informed written consent was obtained from the patient after a thorough discussion of the procedural risks, benefits and alternatives. All questions were addressed. A timeout was performed prior to the initiation of the procedure. A planning axial CT scan was performed. The large loculated hydropneumothorax in the lateral aspect of the right chest was identified. A suitable skin entry site was selected  and marked. The overlying skin was sterilely prepped and draped  in the standard fashion using chlorhexidine skin prep. Local anesthesia was attained by infiltration with 1% lidocaine. A small dermatotomy was made. Under real-time sonographic guidance, an 18 gauge trocar needle was advanced into the collection. An Amplatz wire was advanced into the pleural space. The needle was removed. The skin tract was dilated to 20 [place] and a 20 [place] Thal drain was advanced over the wire and into the cephalad aspect of the collection. Aspiration yields a small volume of thick fluid. The catheter was secured to the skin with 0 Prolene suture and connected to wall suction via a pleurevac. Post placement CT imaging demonstrates improvement in the loculated fluid and gas collection with some re-expansion of the lung. The drain is well positioned. IMPRESSION: Successful placement of a 20 French percutaneous thoracostomy tube into the right sided hydropneumothorax. Aspiration yields thick bloody fluid which was sent for cytology as requested. Signed, Criselda Peaches, MD, [facility]iology Electronically Signed   By: Jacqulynn Cadet M.D.   On: 12/04/2018 18:45      Discharge Exam: Vitals:   12/12/18 2207 12/13/18 0515  BP: 120/82 124/78  Pulse: 73 79  Resp:    Temp: 97.9 F (36.6 C) 97.7 F (36.5 C)  SpO2: 99% 99%   Vitals:   12/12/18 0531 12/12/18 1346 12/12/18 2207 12/13/18 0515  BP: 130/80 131/84 120/82 124/78  Pulse: 64 80 73 79  Resp: 18 18    Temp: 97.6 F (36.4 C) 97.7 F (36.5 C) 97.9 F (36.6 C) 97.7 F (36.5 C)  TempSrc:  Oral Oral Oral  SpO2: 99% 100% 99% 99%  Weight:      Height:        General: Pt is alert, awake, not in acute distress Cardiovascular: RRR, S1/S2 +, no rubs, no gallops Respiratory: CTA bilaterally, no wheezing, no rhonchi Abdominal: Soft, NT, ND, bowel sounds +, perinephric drain from right flank  area Extremities: no edema, no cyanosis    The results of significant diagnostics from this hospitalization (including imaging, microbiology, ancillary and laboratory) are listed below for reference.     Microbiology: Recent Results (from the past 240 hour(s))  Aerobic/Anaerobic Culture (surgical/deep wound)     Status: None   Collection Time: 12/06/18 10:00 AM   Specimen: Abscess  Result Value Ref Range Status   Specimen Description ABSCESS  Final   Special Requests NONE  Final   Gram Stain   Final    ABUNDANT WBC PRESENT, PREDOMINANTLY PMN MODERATE GRAM POSITIVE COCCI IN PAIRS IN CLUSTERS    Culture   Final    MODERATE STAPHYLOCOCCUS AUREUS NO ANAEROBES ISOLATED Performed at [facility], [address] [place], [place] [address]    Report Status 12/11/2018 FINAL  Final   Organism ID, Bacteria STAPHYLOCOCCUS AUREUS  Final      Susceptibility   Staphylococcus aureus - MIC*    CIPROFLOXACIN <=0.5 SENSITIVE Sensitive     ERYTHROMYCIN <=0.25 SENSITIVE Sensitive     GENTAMICIN <=0.5 SENSITIVE Sensitive     OXACILLIN 0.5 SENSITIVE Sensitive     TETRACYCLINE <=1 SENSITIVE Sensitive     VANCOMYCIN <=0.5 SENSITIVE Sensitive     TRIMETH/SULFA <=10 SENSITIVE Sensitive     CLINDAMYCIN <=0.25 SENSITIVE Sensitive     RIFAMPIN <=0.5 SENSITIVE Sensitive     Inducible Clindamycin NEGATIVE Sensitive     * MODERATE STAPHYLOCOCCUS AUREUS     Labs: BNP (last 3 results) No results for input(s): BNP in  the last 8760 hours. Basic Metabolic Panel: Recent Labs  Lab 12/08/18 0207 12/09/18 0223 12/10/18 0229 12/11/18 0329 12/12/18 0419  NA 135 133* 135 135 132*  K 4.6 4.0 4.2 4.3 4.0  CL 100 98 98 98 96*  CO2 26 25 25 26 25   GLUCOSE 93 97 91 96 98  BUN 11 13 15 13 16   CREATININE 1.22 1.10 1.15 1.00 1.10  CALCIUM 8.7* 8.6* 9.0 9.1 8.8*   Liver Function Tests: No results for input(s): AST, ALT, ALKPHOS, BILITOT, PROT, ALBUMIN in the last 168 hours. No results for input(s):  LIPASE, AMYLASE in the last 168 hours. No results for input(s): AMMONIA in the last 168 hours. CBC: Recent Labs  Lab 12/08/18 0207 12/09/18 0223 12/10/18 0229 12/11/18 0329 12/12/18 0419  WBC 11.5* 10.4 11.4* 10.5 8.4  HGB 10.1* 10.4* 10.4* 10.8* 10.8*  HCT 31.2* 31.3* 32.2* 33.1* 33.1*  MCV 81.7 81.3 83.0 82.5 84.0  PLT 628* 626* 648* 620* 608*   Cardiac Enzymes: No results for input(s): CKTOTAL, CKMB, CKMBINDEX, TROPONINI in the last 168 hours. BNP: Invalid input(s): POCBNP CBG: No results for input(s): GLUCAP in the last 168 hours. D-Dimer No results for input(s): DDIMER in the last 72 hours. Hgb A1c No results for input(s): HGBA1C in the last 72 hours. Lipid Profile No results for input(s): CHOL, HDL, LDLCALC, TRIG, CHOLHDL, LDLDIRECT in the last 72 hours. Thyroid function studies No results for input(s): TSH, T4TOTAL, T3FREE, THYROIDAB in the last 72 hours.  Invalid input(s): FREET3 Anemia work up No results for input(s): VITAMINB12, FOLATE, FERRITIN, TIBC, IRON, RETICCTPCT in the last 72 hours. Urinalysis    Component Value Date/Time   COLORURINE STRAW (A) 12/03/2018 0343   APPEARANCEUR CLEAR 12/03/2018 0343   LABSPEC 1.011 12/03/2018 0343   PHURINE 7.0 12/03/2018 0343   GLUCOSEU NEGATIVE 12/03/2018 0343   HGBUR NEGATIVE 12/03/2018 0343   BILIRUBINUR NEGATIVE 12/03/2018 0343   KETONESUR NEGATIVE 12/03/2018 0343   PROTEINUR NEGATIVE 12/03/2018 0343   NITRITE NEGATIVE 12/03/2018 0343   LEUKOCYTESUR NEGATIVE 12/03/2018 0343   Sepsis Labs Invalid input(s): PROCALCITONIN,  WBC,  LACTICIDVEN Microbiology Recent Results (from the past 240 hour(s))  Aerobic/Anaerobic Culture (surgical/deep wound)     Status: None   Collection Time: 12/06/18 10:00 AM   Specimen: Abscess  Result Value Ref Range Status   Specimen Description ABSCESS  Final   Special Requests NONE  Final   Gram Stain   Final    ABUNDANT WBC PRESENT, PREDOMINANTLY PMN MODERATE GRAM POSITIVE COCCI  IN PAIRS IN CLUSTERS    Culture   Final    MODERATE STAPHYLOCOCCUS AUREUS NO ANAEROBES ISOLATED Performed at [facility], [place] [address] [place], [place] [address]    Report Status 12/11/2018 FINAL  Final   Organism ID, Bacteria STAPHYLOCOCCUS AUREUS  Final      Susceptibility   Staphylococcus aureus - MIC*    CIPROFLOXACIN <=0.5 SENSITIVE Sensitive     ERYTHROMYCIN <=0.25 SENSITIVE Sensitive     GENTAMICIN <=0.5 SENSITIVE Sensitive     OXACILLIN 0.5 SENSITIVE Sensitive     TETRACYCLINE <=1 SENSITIVE Sensitive     VANCOMYCIN <=0.5 SENSITIVE Sensitive     TRIMETH/SULFA <=10 SENSITIVE Sensitive     CLINDAMYCIN <=0.25 SENSITIVE Sensitive     RIFAMPIN <=0.5 SENSITIVE Sensitive     Inducible Clindamycin NEGATIVE Sensitive     * MODERATE STAPHYLOCOCCUS AUREUS     Time coordinating discharge: Over 30 minutes  SIGNED:   Darliss Cheney,  MD  [facility] 12/13/2018, 9:32 AM Pager [phone]  If 7PM-7AM, please contact night-coverage [email or web address] Password [email or web address]

## 2018-12-13 NOTE — Discharge Instructions (Signed)
Percutaneous Abscess Drain, Care After °This sheet gives you information about how to care for yourself after your procedure. Your health care provider may also give you more specific instructions. If you have problems or questions, contact your health care provider. °What can I expect after the procedure? °After your procedure, it is common to have: °· A small amount of bruising and discomfort in the area where the drainage tube (catheter) was placed. °· Sleepiness and fatigue. This should go away after the medicines you were given have worn off. °Follow these instructions at home: °Incision care °· Follow instructions from your health care provider about how to take care of your incision. Make sure you: °? Wash your hands with soap and water before you change your bandage (dressing). If soap and water are not available, use hand sanitizer. °? Change your dressing as told by your health care provider. °? Leave stitches (sutures), skin glue, or adhesive strips in place. These skin closures may need to stay in place for 2 weeks or longer. If adhesive strip edges start to loosen and curl up, you may trim the loose edges. Do not remove adhesive strips completely unless your health care provider tells you to do that. °· Check your incision area every day for signs of infection. Check for: °? More redness, swelling, or pain. °? More fluid or blood. °? Warmth. °? Pus or a bad smell. °? Fluid leaking from around your catheter (instead of fluid draining through your catheter). °Catheter care ° °· Follow instructions from your health care provider about emptying and cleaning your catheter and collection bag. You may need to clean the catheter every day so it does not clog. °· If directed, write down the following information every time you empty your bag: °? The date and time. °? The amount of drainage. °General instructions °· Rest at home for 1-2 days after your procedure. Return to your normal activities as told by your  health care provider. °· Do not take baths, swim, or use a hot tub for 24 hours after your procedure, or until your health care provider says that this is okay. °· Take over-the-counter and prescription medicines only as told by your health care provider. °· Keep all follow-up visits as told by your health care provider. This is important. °Contact a health care provider if: °· You have less than 10 mL of drainage a day for 2-3 days in a row, or as directed by your health care provider. °· You have more redness, swelling, or pain around your incision area. °· You have more fluid or blood coming from your incision area. °· Your incision area feels warm to the touch. °· You have pus or a bad smell coming from your incision area. °· You have fluid leaking from around your catheter (instead of through your catheter). °· You have a fever or chills. °· You have pain that does not get better with medicine. °Get help right away if: °· Your catheter comes out. °· You suddenly stop having drainage from your catheter. °· You suddenly have blood in the fluid that is draining from your catheter. °· You become dizzy or you faint. °· You develop a rash. °· You have nausea or vomiting. °· You have difficulty breathing or you feel short of breath. °· You develop chest pain. °· You have problems with your speech or vision. °· You have trouble balancing or moving your arms or legs. °Summary °· It is common to have a small   amount of bruising and discomfort in the area where the drainage tube (catheter) was placed. °· You may be directed to record the amount of drainage from the bag every time you empty it. °· Follow instructions from your health care provider about emptying and cleaning your catheter and collection bag. °This information is not intended to replace advice given to you by your health care provider. Make sure you discuss any questions you have with your health care provider. °Document Released: 09/07/2013 Document  Revised: 04/05/2017 Document Reviewed: 03/15/2016 °Elsevier Patient Education © 2020 [employer] ° °

## 2018-12-15 ENCOUNTER — Other Ambulatory Visit: Payer: Self-pay | Admitting: Nurse Practitioner

## 2018-12-15 DIAGNOSIS — N151 Renal and perinephric abscess: Secondary | ICD-10-CM

## 2018-12-19 ENCOUNTER — Other Ambulatory Visit: Payer: Self-pay

## 2018-12-19 ENCOUNTER — Other Ambulatory Visit (HOSPITAL_COMMUNITY): Payer: Self-pay | Admitting: Physician Assistant

## 2018-12-19 ENCOUNTER — Encounter (HOSPITAL_COMMUNITY): Payer: Self-pay | Admitting: Diagnostic Radiology

## 2018-12-19 ENCOUNTER — Ambulatory Visit (HOSPITAL_COMMUNITY)
Admission: RE | Admit: 2018-12-19 | Discharge: 2018-12-19 | Disposition: A | Discharge status: Home / Self Care | Payer: BC Managed Care – PPO | Source: Ambulatory Visit | Attending: Physician Assistant | Admitting: Physician Assistant

## 2018-12-19 DIAGNOSIS — L0291 Cutaneous abscess, unspecified: Secondary | ICD-10-CM | POA: Diagnosis not present

## 2018-12-19 DIAGNOSIS — Z4803 Encounter for change or removal of drains: Secondary | ICD-10-CM | POA: Diagnosis not present

## 2018-12-19 HISTORY — PX: IR CATHETER TUBE CHANGE: IMG717

## 2018-12-19 MED ORDER — LIDOCAINE HCL 1 % IJ SOLN
INTRAMUSCULAR | Status: AC
  Filled 2018-12-19: qty 20

## 2018-12-19 MED ORDER — IOHEXOL 300 MG/ML  SOLN
50.0000 mL | Freq: Once | INTRAMUSCULAR | Status: AC | PRN
  Administered 2018-12-19: 20 mL via INTRAVENOUS

## 2018-12-19 MED ORDER — LIDOCAINE HCL 1 % IJ SOLN
INTRAMUSCULAR | Status: DC | PRN
  Administered 2018-12-19: 10 mL

## 2018-12-19 NOTE — Procedures (Signed)
Interventional Radiology Procedure:   Indications: Catheter is leaking with flushing.  Procedure: Catheter change and reposition with [place] and fluoroscopy  Findings: Complex right perinephric collection.  Old catheter was removed over wire and tried to reposition with 5 Fr catheter.  Reposition with catheter was not working, therefore, Yueh catheter was directed into more cystic portion of collection with [place] and new 12 Fr drain placed. Removed yellow and red serous fluid.  Complications: None     EBL: less than 20 ml  Plan: Follow up in drain clinic with CT.  Continue to flush drain.    Analiza Cowger R. Anselm Pancoast, MD  Pager: [phone]

## 2018-12-24 ENCOUNTER — Other Ambulatory Visit: Payer: BC Managed Care – PPO

## 2019-01-01 ENCOUNTER — Ambulatory Visit
Admission: RE | Admit: 2019-01-01 | Discharge: 2019-01-01 | Disposition: A | Discharge status: Home / Self Care | Payer: BC Managed Care – PPO | Source: Ambulatory Visit | Attending: Nurse Practitioner | Admitting: Nurse Practitioner

## 2019-01-01 ENCOUNTER — Encounter: Payer: Self-pay | Admitting: *Deleted

## 2019-01-01 ENCOUNTER — Ambulatory Visit
Admission: RE | Admit: 2019-01-01 | Discharge: 2019-01-01 | Disposition: A | Discharge status: Home / Self Care | Payer: BC Managed Care – PPO | Source: Ambulatory Visit | Attending: Student | Admitting: Student

## 2019-01-01 DIAGNOSIS — N151 Renal and perinephric abscess: Secondary | ICD-10-CM

## 2019-01-01 HISTORY — PX: IR RADIOLOGIST EVAL & MGMT: IMG5224

## 2019-01-01 MED ORDER — IOPAMIDOL (ISOVUE-300) INJECTION 61%
100.0000 mL | Freq: Once | INTRAVENOUS | Status: AC | PRN
  Administered 2019-01-01: 100 mL via INTRAVENOUS

## 2019-01-01 NOTE — Progress Notes (Signed)
Referring Physician(s): Dr Melodie Bouillon Dr Eldridge Abrahams   Chief Complaint: The patient is seen in follow up today s/p 8/1: FINDINGS: Complex right retroperitoneal collection posterior to the kidney was localized. Purulent material was aspirated. 12 French drain catheter placed as above. Sample of the aspirate sent for Gram stain and culture.  IMPRESSION: 1. Technically successful CT-guided right retroperitoneal drain catheter placement.  History of present illness:  Hx Head/Neck Ca-- post radiation HTN; Hypothyroid Pulm nodule Presented intitally with chest pain and "sick" Dx Large empyema Rt chest tube placed in IR 7/30 with Dr Laurence Ferrari At that time CT revealed Rt perinephric abscess Drain placed 8/1 with Dr Anselm Pancoast Was planned for VATS per CCM- but chest tube had resolved empyema per CCM-- No VATS Chest tube removed 8/7 with IR PA. Upon DC 8/8-- [facility] spoke to Dr Earleen Newport - suggested keeping drain in perinephric abscess and follow in drain clinic On 8/14-- pt had Rt perinephric drain exchange-- had become partially dislodged.  Now here today for follow up CT and possible removal. Pt states OP is same as input Flushes 5-10 cc daily-- OP is same Finished antibiotics Has no follow up appts with any MD  Denies pain; fever/cills Eating well; no N/V Denies cough or SOB  Past Medical History:  Diagnosis Date  . Hx of radiation therapy 04/05/10 to 05/25/10   head/neck  . Hypertension   . Hypothyroid 08/27/2011  . 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 Pulmonary nodule 04/21/2012    Past Surgical History:  Procedure Laterality Date  . APACO     with tonsillar cancer  . APPENDECTOMY     childhood  . COLONOSCOPY    . IR CATHETER TUBE CHANGE  12/19/2018  . NECK LESION BIOPSY  2011    Dx w/ tonsil cancer   . WISDOM TOOTH EXTRACTION      Allergies: Patient has no known allergies.  Medications: Prior to Admission medications   Medication Sig Start Date  End Date Taking? Authorizing Provider  amLODipine (NORVASC) 10 MG tablet Take 10 mg by mouth daily.  05/22/16   [provider]  levothyroxine (SYNTHROID) 75 MCG tablet Take 75 mcg by mouth daily before breakfast.    [provider]     Family History  Problem Relation Age of Onset  . Stroke Father        dx in his 61s  . CAD Neg Hx   . Colon cancer Neg Hx   . Prostate cancer Neg Hx   . Diabetes Neg Hx   . Colon polyps Neg Hx   . Rectal cancer Neg Hx   . Stomach cancer Neg Hx   . Esophageal cancer Neg Hx     Social History   Socioeconomic History  . Marital status: Married    Spouse name: Not on file  . Number of children: 4  . Years of education: Not on file  . Highest education level: Not on file  Occupational History  . Occupation: Product/process development scientist: [employer]  Social Needs  . Financial resource strain: Not on file  . Food insecurity    Worry: Not on file    Inability: Not on file  . Transportation needs    Medical: Not on file    Non-medical: Not on file  Tobacco Use  . Smoking status: Never Smoker  . Smokeless tobacco: Never Used  Substance and Sexual Activity  . Alcohol use: Yes  Comment: occassionally  . Drug use: No  . Sexual activity: Not on file  Lifestyle  . Physical activity    Days per week: Not on file    Minutes per session: Not on file  . Stress: Not on file  Relationships  . Social Herbalist on phone: Not on file    Gets together: Not on file    Attends religious service: Not on file    Active member of club or organization: Not on file    Attends meetings of clubs or organizations: Not on file    Relationship status: Not on file  Other Topics Concern  . Not on file  Social History Narrative   Married, 4 children, 3 living        Vital Signs: Afebrile  Physical Exam Skin:    General: Skin is warm and dry.     Comments: Site of drain is clean and dry NT no bleeding OP scant in bag Serous  color  Drain removed without complication per Dr Laurence Ferrari     Imaging: No results found.  Labs:  CBC: Recent Labs    12/09/18 0223 12/10/18 0229 12/11/18 0329 12/12/18 0419  WBC 10.4 11.4* 10.5 8.4  HGB 10.4* 10.4* 10.8* 10.8*  HCT 31.3* 32.2* 33.1* 33.1*  PLT 626* 648* 620* 608*    COAGS: Recent Labs    12/04/18 0316  INR 1.3*    BMP: Recent Labs    12/09/18 0223 12/10/18 0229 12/11/18 0329 12/12/18 0419  NA 133* 135 135 132*  K 4.0 4.2 4.3 4.0  CL 98 98 98 96*  CO2 25 25 26 25   GLUCOSE 97 91 96 98  BUN 13 15 13 16   CALCIUM 8.6* 9.0 9.1 8.8*  CREATININE 1.10 1.15 1.00 1.10  GFRNONAA >60 >60 >60 >60  GFRAA >60 >60 >60 >60    LIVER FUNCTION TESTS: Recent Labs    12/02/18 1800 12/05/18 0812  BILITOT 0.3 0.5  AST 21 16  ALT 17 18  ALKPHOS 136* 122  PROT 6.3* 6.3*  ALBUMIN 2.0* 1.9*    Assessment:  Right perinephric abscess drain placed 12/06/18 CT today reveals no fluid collection per Dr Garlon Hatchet removal without complication To follow up with Dr Sammuel Bailiff - pts PMD He has good understanding of plan  Signed: Lavonia Drafts, PA-C 01/01/2019, 2:15 PM   Please refer to Dr. Laurence Ferrari attestation of this note for management and plan.

## 2020-11-13 IMAGING — MR MRI ABDOMEN WITH AND WITHOUT CONTRAST
17 series · 48 of 48 positions shown · IV contrast (agent unspecified)
Comparison: CT abdomen/pelvis dated 12/03/2018. CT chest dated
12/03/2018.

CLINICAL DATA: Right renal mass

EXAM:
MRI ABDOMEN WITHOUT AND WITH CONTRAST
TECHNIQUE: Multiplanar multisequence MR imaging of the abdomen was performed
both before and after the administration of intravenous contrast.
CONTRAST:  6 mL Gadovist IV

[Series 4: cor haste · coronal · 6.0mm · 1.06mm/px · 2 of 30 slices shown]
[im 1/30]
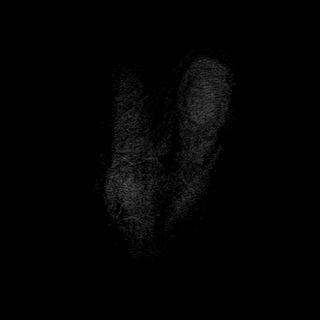
[im 30/30]
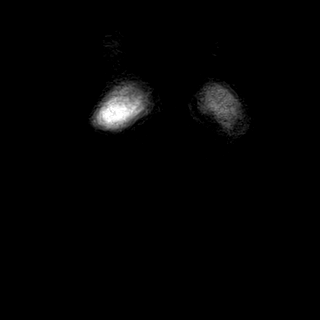

[Series 5: ax haste · axial · 6.0mm · 1.06mm/px · z∈[-278,+3]mm · 2 of 40 slices shown]
[im 1/40]
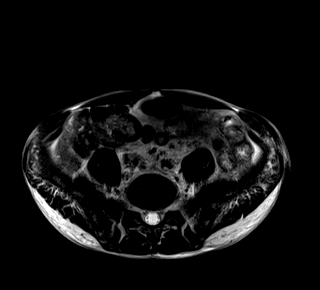
[im 40/40]
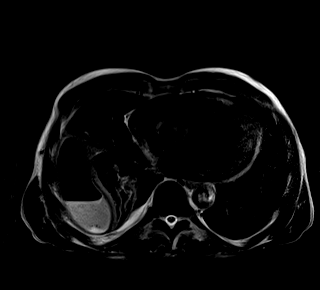

[Series 8: T2 fat-sat · axial · 6.0mm · 1.06mm/px · 1 of 40 slices shown]
[im 1/40]
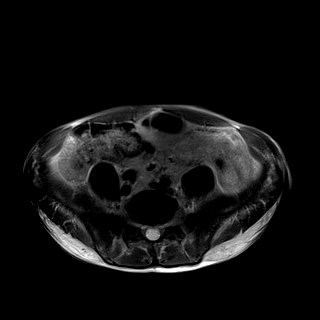

[Series 9: t1_vibe_opp-in_tra_p4_bh · axial · 3.0mm · 1.12mm/px · z∈[-280,+5]mm · 7 of 192 slices shown]
[im 1/192]
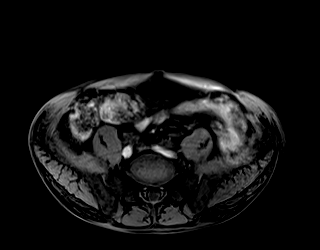
[im 32/192]
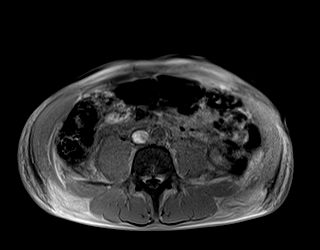
[im 64/192]
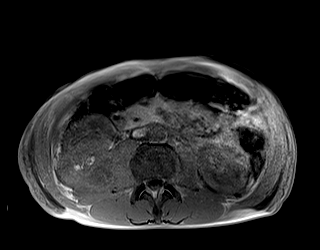
[im 96/192]
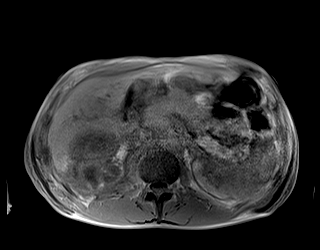
[im 128/192]
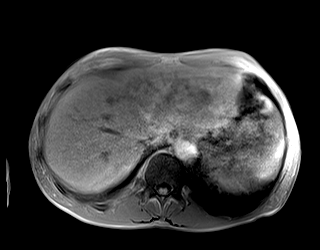
[im 160/192]
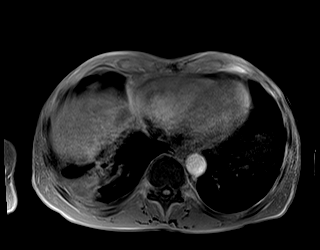
[im 192/192]
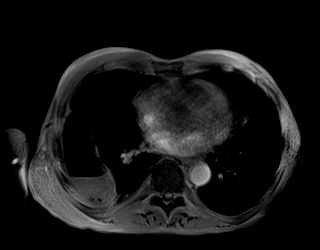

[Series 10: DWI · axial · 6.0mm · 1.42mm/px · z∈[-278,+3]mm · 4 of 120 slices shown (1 of 2)]
[im 1/120]
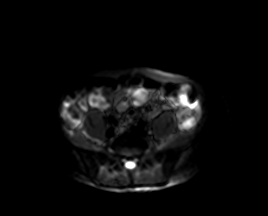
[im 40/120]
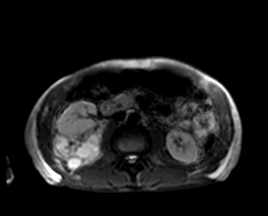
[im 80/120]
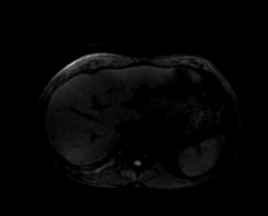
[im 120/120]
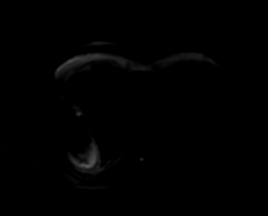

[Series 11: DWI · axial · 6.0mm · 1.42mm/px · 1 of 40 slices shown (2 of 2)]
[im 1/40]
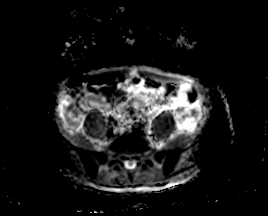

[Series 12: bSSFP · axial · 6.0mm · 0.74mm/px · 1 of 40 slices shown]
[im 1/40]
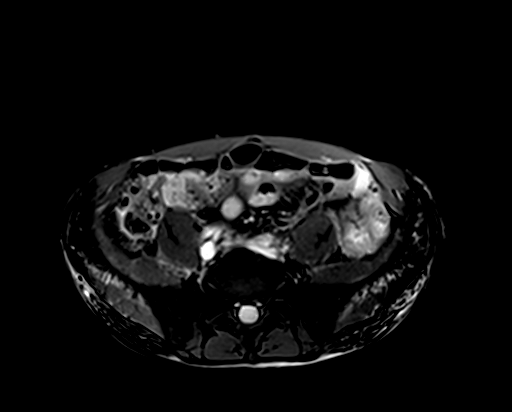

[Series 13: t1_vibe_fs_tra_p4_bh_pre · axial · 3.0mm · 1.06mm/px · z∈[-253,+8]mm · 3 of 88 slices shown]
[im 1/88]
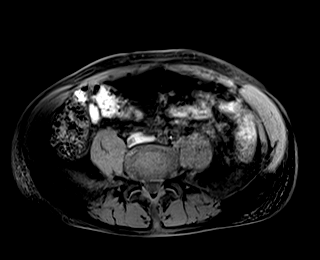
[im 44/88]
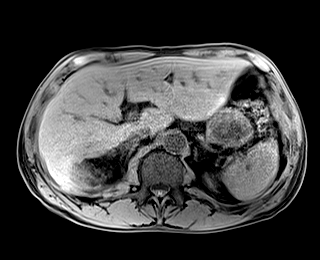
[im 88/88]
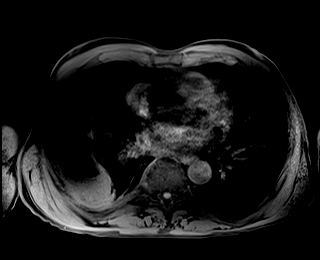

[Series 15: t1_vibe_fs_tra_p4_bh_post · axial · 3.0mm · 1.06mm/px · z∈[-253,+8]mm · 3 of 88 slices shown (1 of 4)]
[im 1/88]
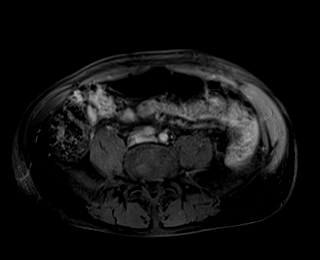
[im 44/88]
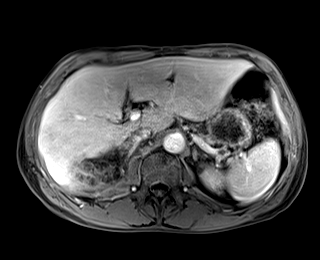
[im 88/88]
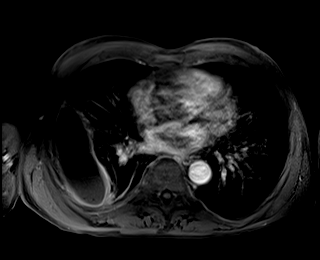

[Series 16: t1_vibe_fs_tra_p4_bh_post_sub · axial · 3.0mm · 1.06mm/px · z∈[-253,+8]mm · 3 of 88 slices shown (1 of 4)]
[im 1/88]
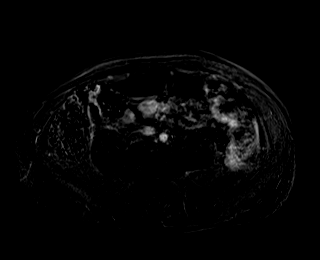
[im 44/88]
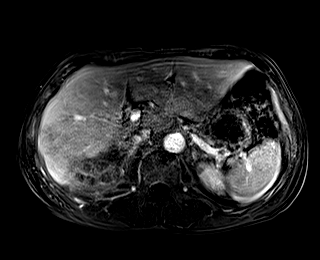
[im 88/88]
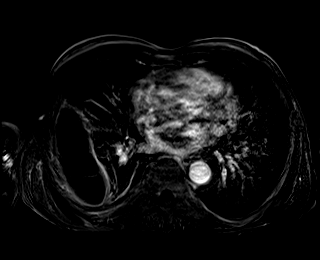

[Series 17: t1_vibe_fs_tra_p4_bh_post · axial · 3.0mm · 1.06mm/px · z∈[-253,+8]mm · 3 of 88 slices shown (2 of 4)]
[im 1/88]
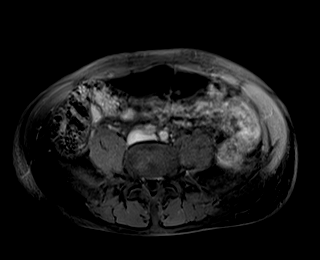
[im 44/88]
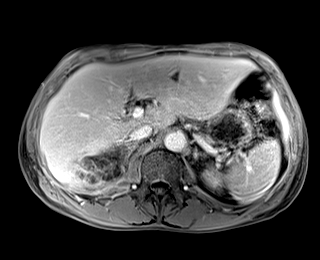
[im 88/88]
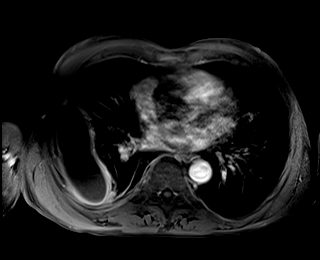

[Series 18: t1_vibe_fs_tra_p4_bh_post_sub · axial · 3.0mm · 1.06mm/px · z∈[-253,+8]mm · 3 of 88 slices shown (2 of 4)]
[im 1/88]
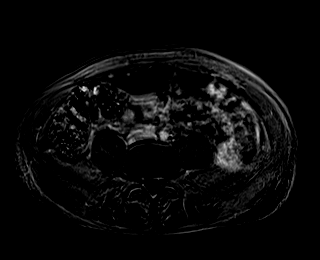
[im 44/88]
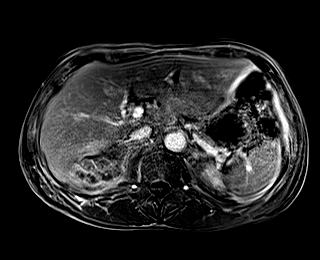
[im 88/88]
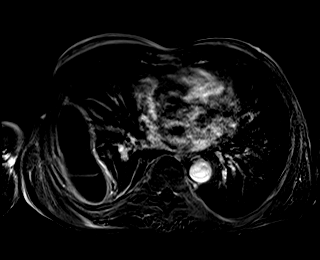

[Series 19: t1_vibe_fs_tra_p4_bh_post · axial · 3.0mm · 1.06mm/px · z∈[-253,+8]mm · 3 of 88 slices shown (3 of 4)]
[im 1/88]
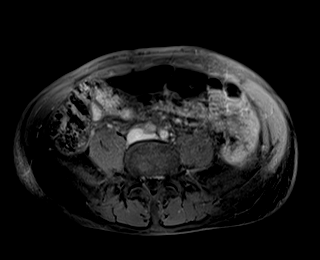
[im 44/88]
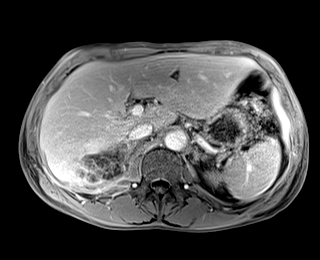
[im 88/88]
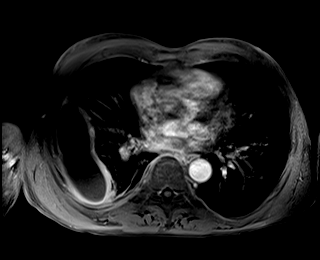

[Series 20: t1_vibe_fs_tra_p4_bh_post_sub · axial · 3.0mm · 1.06mm/px · z∈[-253,+8]mm · 3 of 88 slices shown (3 of 4)]
[im 1/88]
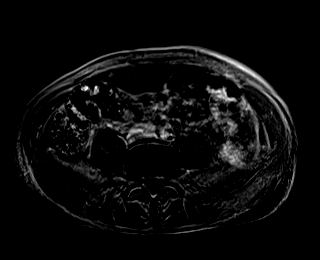
[im 44/88]
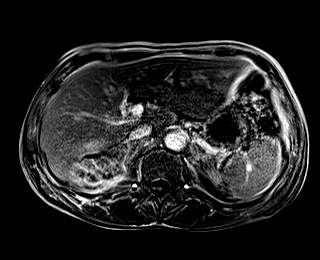
[im 88/88]
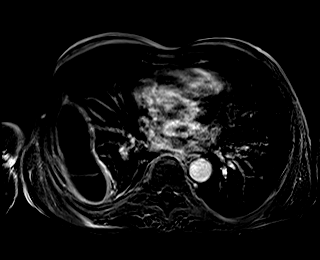

[Series 21: t1_vibe_fs_tra_p4_bh_post · axial · 3.0mm · 1.06mm/px · z∈[-253,+8]mm · 3 of 88 slices shown (4 of 4)]
[im 1/88]
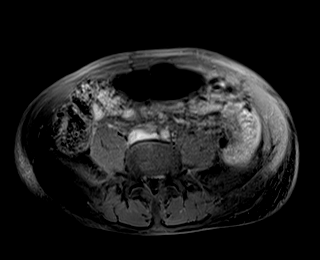
[im 44/88]
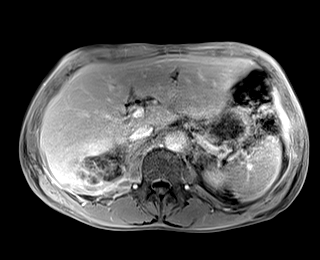
[im 88/88]
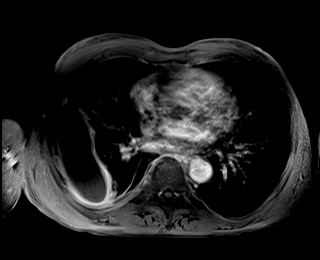

[Series 22: t1_vibe_fs_tra_p4_bh_post_sub · axial · 3.0mm · 1.06mm/px · z∈[-253,+8]mm · 3 of 88 slices shown (4 of 4)]
[im 1/88]
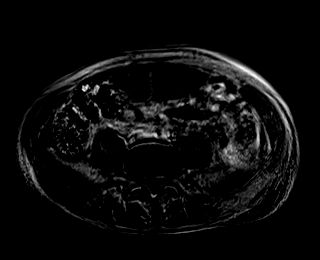
[im 44/88]
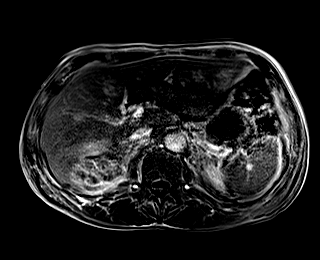
[im 88/88]
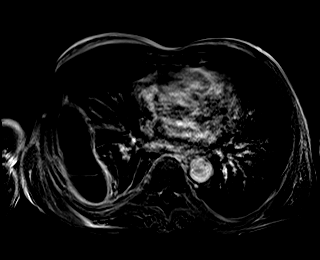

[Series 24: T1 dynamic post-contrast · coronal · 3.0mm · 1.19mm/px · 3 of 72 slices shown]
[im 1/72]
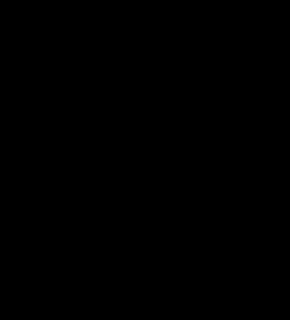
[im 36/72]
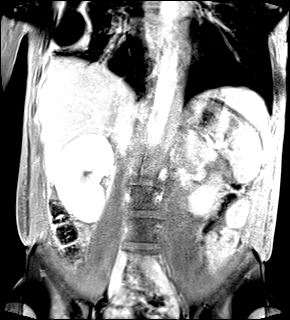
[im 72/72]
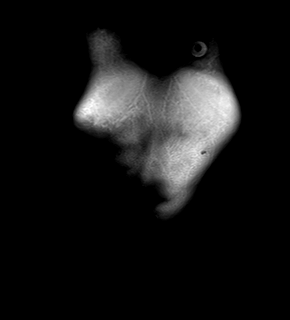

[48 of 48 positions shown; findings below may reference images not displayed]

FINDINGS: Lower chest: Pleural-based fluid/gas collection along the lateral
right hemithorax, compatible with empyema, incompletely visualized.

Hepatobiliary: Liver is within normal limits.

Status post cholecystectomy. No intrahepatic or extrahepatic ductal
dilatation.

Pancreas:  Within normal limits.

Spleen:  Within normal limits.

Adrenals/Urinary Tract:  Adrenal glands are within normal limits.

Complex cystic lesion with multiple enhancing septations and thick
enhancing rim along the posterior right pararenal space, measuring
6.1 x 7.9 x 8.8 cm, corresponding to the CT abnormality. Associated
restricted diffusion. No intrinsic hyperintensity on precontrast T1
imaging to suggest hemorrhage. No solid enhancing component. Lesion
appears to arise from the posterior left lower kidney (series
15/image 61), favoring a perinephric abscess.

11 mm cyst in the medial left upper kidney (series 15/image 5). No
hydronephrosis.

Stomach/Bowel: Stomach is within normal limits.

Visualized bowel is unremarkable.

Vascular/Lymphatic:  No evidence of abdominal aortic aneurysm.

No suspicious abdominal lymphadenopathy.

Other:  No abdominal ascites.

Musculoskeletal: No focal osseous lesions.
IMPRESSION: 8.8 cm abscess in the posterior right pararenal space, arising from
the posterior right lower kidney, corresponding to the CT
abnormality.

Right pleural empyema, incompletely visualized, better evaluated on
CT chest.
